# Patient Record
Sex: Female | Born: 1965 | Hispanic: No | Marital: Married | Smoking: Current every day smoker
Health system: Southern US, Community
[De-identification: ages and names within clinical notes are randomized; demographics above are authoritative.]

## PROBLEM LIST (undated history)

## (undated) DIAGNOSIS — M549 Dorsalgia, unspecified: Secondary | ICD-10-CM

---

## 1998-09-13 ENCOUNTER — Emergency Department (HOSPITAL_COMMUNITY): Admission: EM | Admit: 1998-09-13 | Discharge: 1998-09-13 | Payer: Self-pay | Admitting: Emergency Medicine

## 1999-09-20 ENCOUNTER — Emergency Department (HOSPITAL_COMMUNITY): Admission: EM | Admit: 1999-09-20 | Discharge: 1999-09-20 | Payer: Self-pay | Admitting: Emergency Medicine

## 2008-02-14 ENCOUNTER — Other Ambulatory Visit: Admission: RE | Admit: 2008-02-14 | Discharge: 2008-02-14 | Payer: Self-pay | Admitting: Family Medicine

## 2013-07-01 ENCOUNTER — Encounter (HOSPITAL_COMMUNITY): Payer: Self-pay | Admitting: Emergency Medicine

## 2013-07-01 ENCOUNTER — Emergency Department (HOSPITAL_COMMUNITY): Payer: Worker's Compensation

## 2013-07-01 ENCOUNTER — Emergency Department (HOSPITAL_COMMUNITY)
Admission: EM | Admit: 2013-07-01 | Discharge: 2013-07-01 | Disposition: A | Discharge status: Home / Self Care | Payer: Worker's Compensation | Attending: Emergency Medicine | Admitting: Emergency Medicine

## 2013-07-01 DIAGNOSIS — R259 Unspecified abnormal involuntary movements: Secondary | ICD-10-CM | POA: Insufficient documentation

## 2013-07-01 DIAGNOSIS — X500XXA Overexertion from strenuous movement or load, initial encounter: Secondary | ICD-10-CM | POA: Insufficient documentation

## 2013-07-01 DIAGNOSIS — R209 Unspecified disturbances of skin sensation: Secondary | ICD-10-CM | POA: Insufficient documentation

## 2013-07-01 DIAGNOSIS — Y990 Civilian activity done for income or pay: Secondary | ICD-10-CM | POA: Insufficient documentation

## 2013-07-01 DIAGNOSIS — Y9389 Activity, other specified: Secondary | ICD-10-CM | POA: Insufficient documentation

## 2013-07-01 DIAGNOSIS — IMO0002 Reserved for concepts with insufficient information to code with codable children: Secondary | ICD-10-CM | POA: Insufficient documentation

## 2013-07-01 DIAGNOSIS — Y9289 Other specified places as the place of occurrence of the external cause: Secondary | ICD-10-CM | POA: Insufficient documentation

## 2013-07-01 DIAGNOSIS — M549 Dorsalgia, unspecified: Secondary | ICD-10-CM

## 2013-07-01 HISTORY — DX: Dorsalgia, unspecified: M54.9

## 2013-07-01 MED ORDER — DIAZEPAM 5 MG PO TABS
5.0000 mg | ORAL_TABLET | Freq: Once | ORAL | Status: AC
  Administered 2013-07-01: 5 mg via ORAL
  Filled 2013-07-01: qty 1

## 2013-07-01 MED ORDER — PROMETHAZINE HCL 25 MG PO TABS: 25.0000 mg | ORAL_TABLET | Freq: Four times a day (QID) | ORAL | Status: DC | PRN

## 2013-07-01 MED ORDER — OXYCODONE-ACETAMINOPHEN 5-325 MG PO TABS: 2.0000 | ORAL_TABLET | Freq: Four times a day (QID) | ORAL | Status: DC | PRN

## 2013-07-01 MED ORDER — ONDANSETRON 8 MG PO TBDP
8.0000 mg | ORAL_TABLET | Freq: Once | ORAL | Status: AC
  Administered 2013-07-01: 8 mg via ORAL
  Filled 2013-07-01: qty 1

## 2013-07-01 MED ORDER — MORPHINE SULFATE 4 MG/ML IJ SOLN
4.0000 mg | Freq: Once | INTRAMUSCULAR | Status: AC
  Administered 2013-07-01: 4 mg via INTRAMUSCULAR
  Filled 2013-07-01: qty 1

## 2013-07-01 NOTE — ED Provider Notes (Signed)
CSN: [id number]     Arrival date & time 07/01/13  1309 History  This chart was scribed for non-physician practitioner working with Celene Kras, MD by Ashley Jacobs, ED scribe. This patient was seen in room WTR9/WTR9 and the patient's care was started at 2:06 PM.      Chief Complaint  Patient presents with  . Back Pain    Patient is a 47 y.o. female presenting with back pain. The history is provided by the patient and medical records. No language interpreter was used.  Back Pain Location:  Thoracic spine Quality:  Burning Pain severity:  Severe Pain is:  Same all the time Onset quality:  Gradual Timing:  Constant Progression:  Worsening Chronicity:  New Context: lifting heavy objects and occupational injury   Relieved by:  Nothing Worsened by:  Bending, deep breathing and touching Ineffective treatments:  None tried  HPI Comments: Courtney Ramsey is a 47 y.o. 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 She stated that she did have the the pain in her back for her past few weeks and that she has to be in the same postion for eight hours and attributes the pain to it. Pt mentions tremors in legs and left hand is numb because she has been gripping the side of the bed so tightly for so long. The pain sometimes gets so bad that it is hard for her to catch her breath. Pt does not have bowel or bladder incontinence, no drug use, no CA. No fevers.  Pt does not have any known allergies.  Past Medical History  Diagnosis Date  . Back pain    History reviewed. No pertinent past surgical history. No family history on file. History  Substance Use Topics  . Smoking status: Not on file  . Smokeless tobacco: Not on file  . Alcohol Use: Not on file   OB History   Grav Para Term Preterm Abortions TAB SAB Ect Mult Living                  Review of Systems  Gastrointestinal: Negative for nausea and vomiting.  Musculoskeletal: Positive for back pain.  All other systems reviewed and are negative.    Allergies  Review of patient's allergies indicates no known allergies.  Home Medications   Current Outpatient Rx  Name  Route  Sig  Dispense  Refill  . ibuprofen (ADVIL,MOTRIN) 200 MG tablet   Oral   Take 200 mg by mouth every 6 (six) hours as needed for pain.         Marland Kitchen oxyCODONE-acetaminophen (PERCOCET/ROXICET) 5-325 MG per tablet   Oral   Take 2 tablets by mouth every 6 (six) hours as needed for pain.   12 tablet   0   . promethazine (PHENERGAN) 25 MG tablet   Oral   Take 1 tablet (25 mg total) by mouth every 6 (six) hours as needed for nausea.   12 tablet   0    BP 148/88  Pulse 94  Resp 20  SpO2 100% Physical Exam  Nursing note and vitals reviewed. Constitutional: She is oriented to person, place, and time. She appears well-developed and well-nourished. She appears distressed.  Tearful.   HENT:  Head: Normocephalic and atraumatic.  Right Ear: External ear normal.  Left Ear: External ear normal.  Nose: Nose normal.  Mouth/Throat: Oropharynx is clear and moist.  Eyes: Conjunctivae are normal.  Neck: Normal range of motion.  Cardiovascular: Normal rate, regular rhythm, normal heart sounds, intact distal pulses and normal pulses.   Pulmonary/Chest: Effort normal and breath sounds normal. No stridor. No respiratory distress. She has no wheezes. She has no rales.  Abdominal: Soft. She exhibits no distension.  Musculoskeletal: Normal range of motion.  Tenderness to palpitation over thoracic spine with right sided diffuse tenderness. No deformity or step offs  Neurological: She is alert and oriented to person, place, and time. She has normal strength. No sensory deficit. She exhibits normal muscle tone. Coordination normal.  Strength 5/5 in all extremities  Skin: Skin is warm and dry. She is not  diaphoretic. No erythema.  Psychiatric: She has a normal mood and affect. Her behavior is normal.    ED Course  Procedures (including critical care time) DIAGNOSTIC STUDIES: Oxygen Saturation is 100% on room air, normal by my interpretation.    COORDINATION OF CARE: 2:11 PM Discussed course of care with pt which includes pain medication and Zofran . Pt understands and agrees. Labs Review Labs Reviewed - No data to display Imaging Review Dg Ribs Unilateral W/chest Right  07/01/2013   CLINICAL DATA:  Back pain with right rib pain  EXAM: RIGHT RIBS AND CHEST - 3+ VIEW  COMPARISON:  None.  FINDINGS: No fracture or other bone lesions are seen involving the ribs. There is no evidence of pneumothorax or pleural effusion. Both lungs are clear. Heart size and mediastinal contours are within normal limits.  IMPRESSION: Negative.   Electronically Signed   By: Marlan Palau M.D.   On: 07/01/2013 15:05   Dg Thoracic Spine 2 View  07/01/2013   CLINICAL DATA:  Back pain  EXAM: THORACIC SPINE - 2 VIEW  COMPARISON:  None.  FINDINGS: Negative for thoracic fracture. No significant degenerative change.  IMPRESSION: Negative.   Electronically Signed   By: Marlan Palau M.D.   On: 07/01/2013 15:06    MDM   1. Back pain    Patient with back pain.  No neurological deficits and normal neuro exam.  Patient can walk but states is painful.  No loss of bowel or bladder control.  No concern for cauda equina.  No fever, night sweats, weight loss, h/o cancer, IVDU.  RICE protocol and pain medicine indicated and discussed with patient.    I personally performed the services described in this documentation, which was scribed in my presence. The recorded information has been reviewed and is accurate.     Mora Bellman, PA-C 07/01/13 1736  Mora Bellman, PA-C 07/01/13 1737

## 2013-07-01 NOTE — Progress Notes (Signed)
[facility] did not get to see patient but will  Be sending her information about the [facility], using the address provided.

## 2013-07-01 NOTE — ED Notes (Signed)
Pt has a hx of back pain and was working at [employer] in [employer] [place] and while lifting boxes she bent over and mid back.

## 2013-07-03 NOTE — ED Provider Notes (Signed)
Medical screening examination/treatment/procedure(s) were performed by non-physician practitioner and as supervising physician I was immediately available for consultation/collaboration.     Abrar Bilton R Muadh Creasy, MD 07/03/13 0000 

## 2013-09-09 ENCOUNTER — Ambulatory Visit: Payer: BC Managed Care – PPO

## 2013-10-16 ENCOUNTER — Other Ambulatory Visit (HOSPITAL_COMMUNITY)
Admission: RE | Admit: 2013-10-16 | Discharge: 2013-10-16 | Disposition: A | Discharge status: Home / Self Care | Payer: BC Managed Care – PPO | Source: Ambulatory Visit | Attending: Family Medicine | Admitting: Family Medicine

## 2013-10-16 DIAGNOSIS — Z01419 Encounter for gynecological examination (general) (routine) without abnormal findings: Secondary | ICD-10-CM | POA: Insufficient documentation

## 2014-02-05 ENCOUNTER — Other Ambulatory Visit: Payer: Self-pay | Admitting: Family Medicine

## 2014-02-05 ENCOUNTER — Ambulatory Visit
Admission: RE | Admit: 2014-02-05 | Discharge: 2014-02-05 | Disposition: A | Discharge status: Home / Self Care | Payer: BC Managed Care – PPO | Source: Ambulatory Visit | Attending: Family Medicine | Admitting: Family Medicine

## 2014-02-05 DIAGNOSIS — Z008 Encounter for other general examination: Secondary | ICD-10-CM

## 2014-04-16 IMAGING — CR DG RIBS W/ CHEST 3+V*R*
3 series · 3 of 3 positions shown · non-contrast
Comparison: None.

CLINICAL DATA: Back pain with right rib pain

EXAM:
RIGHT RIBS AND CHEST - 3+ VIEW

[w chest pa]
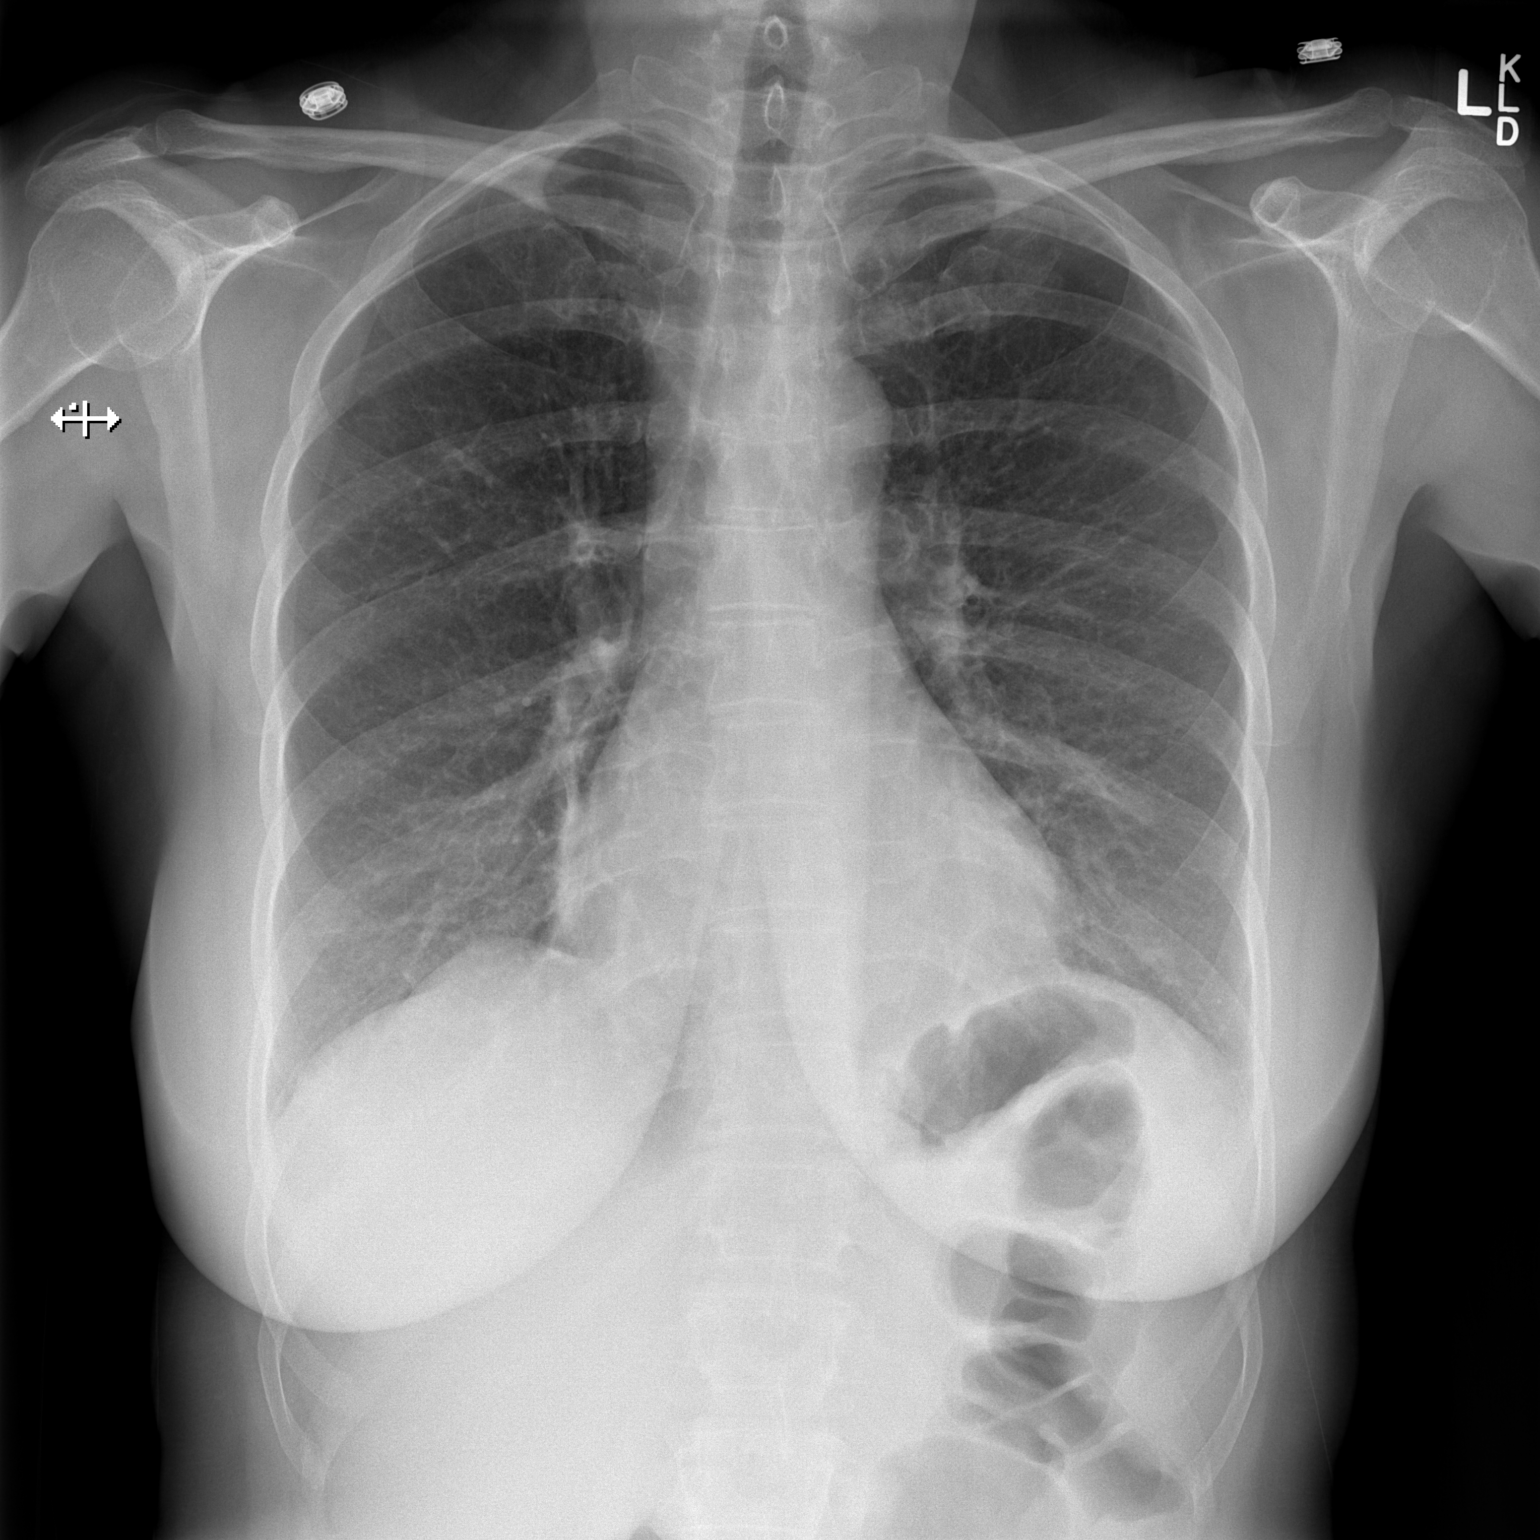

[w ribs ap upper right]
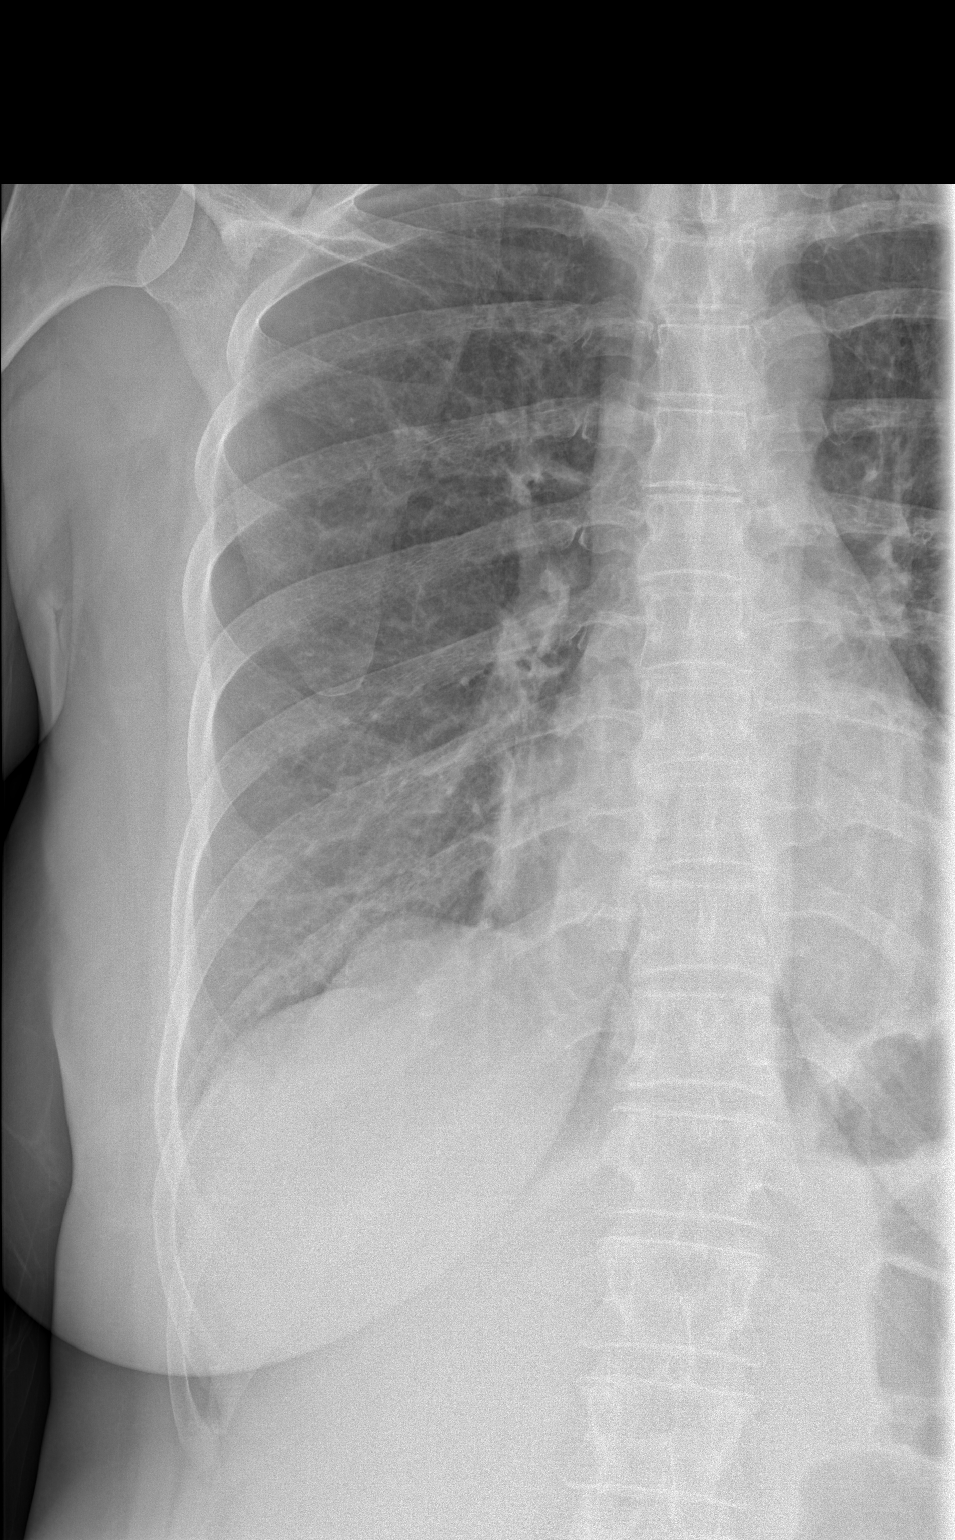

[w ribs obl right]
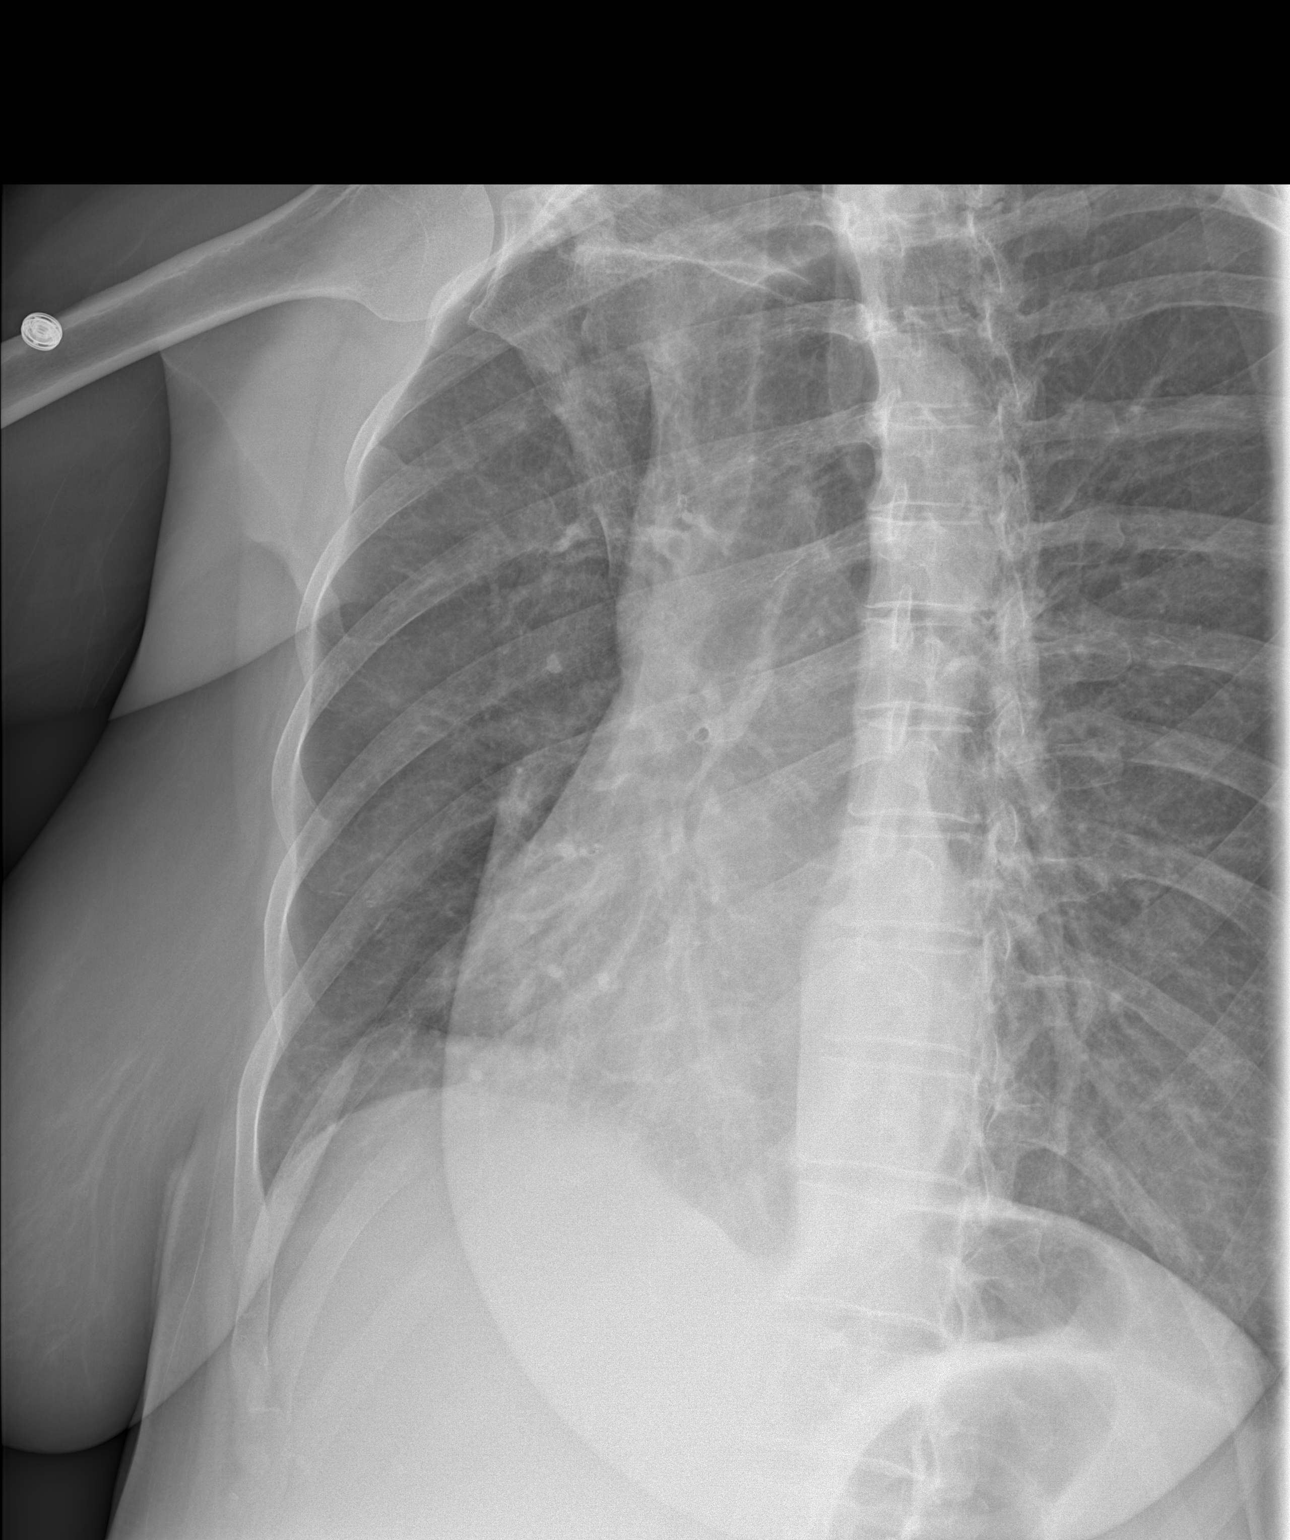

[3 of 3 positions shown; findings below may reference images not displayed]

FINDINGS: No fracture or other bone lesions are seen involving the ribs. There
is no evidence of pneumothorax or pleural effusion. Both lungs are
clear. Heart size and mediastinal contours are within normal limits.
IMPRESSION: Negative.

## 2016-02-26 ENCOUNTER — Ambulatory Visit (INDEPENDENT_AMBULATORY_CARE_PROVIDER_SITE_OTHER): Payer: BLUE CROSS/BLUE SHIELD | Admitting: Family Medicine

## 2016-02-26 VITALS — BP 119/69 | HR 71 | Temp 97.9°F | Resp 16 | Ht 65.0 in | Wt 178.0 lb

## 2016-02-26 DIAGNOSIS — G44209 Tension-type headache, unspecified, not intractable: Secondary | ICD-10-CM

## 2016-02-26 DIAGNOSIS — R0789 Other chest pain: Secondary | ICD-10-CM

## 2016-02-26 DIAGNOSIS — F439 Reaction to severe stress, unspecified: Secondary | ICD-10-CM

## 2016-02-26 DIAGNOSIS — R002 Palpitations: Secondary | ICD-10-CM

## 2016-02-26 DIAGNOSIS — Z658 Other specified problems related to psychosocial circumstances: Secondary | ICD-10-CM

## 2016-02-26 DIAGNOSIS — M542 Cervicalgia: Secondary | ICD-10-CM

## 2016-02-26 LAB — POCT CBC
Granulocyte percent: 59.5 %G (ref 37–80)
HCT, POC: 46.1 % (ref 37.7–47.9)
Hemoglobin: 16.5 g/dL — AB (ref 12.2–16.2)
LYMPH, POC: 4.4 — AB (ref 0.6–3.4)
MCH: 32.2 pg — AB (ref 27–31.2)
MCHC: 35.8 g/dL — AB (ref 31.8–35.4)
MCV: 89.8 fL (ref 80–97)
MID (cbc): 0.6 (ref 0–0.9)
MPV: 8.6 fL (ref 0–99.8)
PLATELET COUNT, POC: 257 10*3/uL (ref 142–424)
POC Granulocyte: 7.3 — AB (ref 2–6.9)
POC LYMPH PERCENT: 35.4 %L (ref 10–50)
POC MID %: 5.1 %M (ref 0–12)
RBC: 5.14 M/uL (ref 4.04–5.48)
RDW, POC: 13.2 %
WBC: 12.3 10*3/uL — AB (ref 4.6–10.2)

## 2016-02-26 LAB — TSH: TSH: 2.51 mIU/L

## 2016-02-26 MED ORDER — CLONAZEPAM 0.5 MG PO TABS: 0.5000 mg | ORAL_TABLET | Freq: Three times a day (TID) | ORAL | Status: DC | PRN

## 2016-02-26 NOTE — Patient Instructions (Addendum)
IF you received an x-ray today, you will receive an invoice from [facility]. Please contact [facility] at [phone] with questions or concerns regarding your invoice.   IF you received labwork today, you will receive an invoice from [employer]. Please contact [facility] at [phone] with questions or concerns regarding your invoice.   Our billing staff will not be able to assist you with questions regarding bills from these companies.  You will be contacted with the lab results as soon as they are available. The fastest way to get your results is to activate your My Chart account. Instructions are located on the last page of this paperwork. If you have not heard from [place] regarding the results in 2 weeks, please contact this office.    Your headache and neck pain appears to be due to muscle spasm and tension headache from the current stress you're experiencing. Try the clonazepam that was prescribed for stress, heat and gentle range of motion and stretching, Icy hot, and Tylenol over-the-counter if needed.  recheck in next 1 week.  If this is not helping your headache or neck pain, the symptoms worsen, or you have any fever, return here or emergency room sooner. Otherwise can recheck next week and repeat blood count at that time as your infection fighting cells were borderline elevated today.   I did provide a note out of work this next week, but follow-up with me within that time period to see how things are going. I would like for you to meet with a counselor either through your work EAP program, or see the numbers provided below. Additionally you can take the clonazepam up to 3 times per day as needed for anxiety or stress.  Work on increasing exercise or activity including walking at least once per day to help with stress, and recheck here or the emergency room if any your symptoms worsen prior to follow-up.  Vivia Budge: [phone] Arvil Chaco: [phone]   Stress and Stress Management Stress is a normal reaction to life events. It is what you feel when life demands more than you are used to or more than you can handle. Some stress can be useful. For example, the stress reaction can help you catch the last bus of the day, study for a test, or meet a deadline at work. But stress that occurs too often or for too long can cause problems. It can affect your emotional health and interfere with relationships and normal daily activities. Too much stress can weaken your immune system and increase your risk for physical illness. If you already have a medical problem, stress can make it worse. CAUSES  All sorts of life events may cause stress. An event that causes stress for one person may not be stressful for another person. Major life events commonly cause stress. These may be positive or negative. Examples include losing your job, moving into a new home, getting married, having a baby, or losing a loved one. Less obvious life events may also cause stress, especially if they occur day after day or in combination. Examples include working long hours, driving in traffic, caring for children, being in debt, or being in a difficult relationship. SIGNS AND SYMPTOMS Stress may cause emotional symptoms including, the following:  Anxiety. This is feeling worried, afraid, on edge, overwhelmed, or out of control.  Anger. This is feeling irritated or impatient.  Depression. This is feeling sad, down, helpless, or guilty.  Difficulty focusing,  remembering, or making decisions. Stress may cause physical symptoms, including the following:   Aches and pains. These may affect your head, neck, back, stomach, or other areas of your body.  Tight muscles or clenched jaw.  Low energy or trouble sleeping. Stress may cause unhealthy behaviors, including the following:   Eating to feel better (overeating) or skipping meals.  Sleeping too little, too  much, or both.  Working too much or putting off tasks (procrastination).  Smoking, drinking alcohol, or using drugs to feel better. DIAGNOSIS  Stress is diagnosed through an assessment by your health care provider. Your health care provider will ask questions about your symptoms and any stressful life events.Your health care provider will also ask about your medical history and may order blood tests or other tests. Certain medical conditions and medicine can cause physical symptoms similar to stress. Mental illness can cause emotional symptoms and unhealthy behaviors similar to stress. Your health care provider may refer you to a mental health professional for further evaluation.  TREATMENT  Stress management is the recommended treatment for stress.The goals of stress management are reducing stressful life events and coping with stress in healthy ways.  Techniques for reducing stressful life events include the following:  Stress identification. Self-monitor for stress and identify what causes stress for you. These skills may help you to avoid some stressful events.  Time management. Set your priorities, keep a calendar of events, and learn to say "no." These tools can help you avoid making too many commitments. Techniques for coping with stress include the following:  Rethinking the problem. Try to think realistically about stressful events rather than ignoring them or overreacting. Try to find the positives in a stressful situation rather than focusing on the negatives.  Exercise. Physical exercise can release both physical and emotional tension. The key is to find a form of exercise you enjoy and do it regularly.  Relaxation techniques. These relax the body and mind. Examples include yoga, meditation, tai chi, biofeedback, deep breathing, progressive muscle relaxation, listening to music, being out in nature, journaling, and other hobbies. Again, the key is to find one or more that you enjoy  and can do regularly.  Healthy lifestyle. Eat a balanced diet, get plenty of sleep, and do not smoke. Avoid using alcohol or drugs to relax.  Strong support network. Spend time with family, friends, or other people you enjoy being around.Express your feelings and talk things over with someone you trust. Counseling or talktherapy with a mental health professional may be helpful if you are having difficulty managing stress on your own. Medicine is typically not recommended for the treatment of stress.Talk to your health care provider if you think you need medicine for symptoms of stress. HOME CARE INSTRUCTIONS  Keep all follow-up visits as directed by your health care provider.  Take all medicines as directed by your health care provider. SEEK MEDICAL CARE IF:  Your symptoms get worse or you start having new symptoms.  You feel overwhelmed by your problems and can no longer manage them on your own. SEEK IMMEDIATE MEDICAL CARE IF:  You feel like hurting yourself or someone else.   This information is not intended to replace advice given to you by your health care provider. Make sure you discuss any questions you have with your health care provider.   Document Released: 03/14/2001 Document Revised: 10/09/2014 Document Reviewed: 05/13/2013 Elsevier Interactive Patient Education [employer].

## 2016-02-26 NOTE — Progress Notes (Addendum)
By signing my name below I, Tereasa Coop, attest that this documentation has been prepared under the direction and in the presence of Wendie Agreste, MD. Electonically Signed. Tereasa Coop, Scribe 02/26/2016 at 1:01 PM   Subjective:    Patient ID: Courtney Ramsey, female    DOB: 23-May-1966, 50 y.o.   MRN: [id number]  Chief Complaint  Patient presents with  . Headache    back of head and front x 1 day   . Neck Pain    x 1 day has been worse, pt. has been hurting off and on x 2 months, has stress   . CHEST TIGHTNESS    HPI Courtney Ramsey is a 50 y.o. female who presents to the [facility] complaining of HA for the past day. HA is in back of neck, top of head, and frontal. Pt has had intermittent neck pain for the past 2 months that has been worse than usual for the past day. HA is described as a tightness and pressure. Neck pain extends into her shoulder. Pt denies any blurred vision, slurred speech, fever, N/V/D, weakness or numbness.  Pt reports increased stress recently. Pt states that she has had similar neck pain and HAs with stress. Upon asking source of stress, pt became tearful. Pt works as a Librarian, academic at [employer]. 2 months ago there was a rule change and she used to work with 5 people and now has to do all the work by her self. Pt feels overwhelmed by the increased workload. Pt has worked for [employer] for 19 years. Pt has been taking OTC Mood plus SAM-E with zinc and  Pt had a coaching session at work yesterday. Pt does not want to go back to work at all.   Pt also c/o chest tightness and chest palpitations when stress is increased.  Pt denies any thoughts of suicide, self harm, homicide, or harming others.   Depression screen PHQ 2/9 02/26/2016  Decreased Interest 1  Down, Depressed, Hopeless 3  PHQ - 2 Score 4  Altered sleeping 3  Tired, decreased energy 3  Change in appetite 3  Feeling bad or failure about yourself  3  Trouble concentrating 0   Moving slowly or fidgety/restless 0  Suicidal thoughts 0  PHQ-9 Score 16  Difficult doing work/chores Not difficult at all       There are no active problems to display for this patient.  Past Medical History  Diagnosis Date  . Back pain    History reviewed. No pertinent past surgical history. No Known Allergies Prior to Admission medications   Medication Sig Start Date End Date Taking? Authorizing Provider  S-Adenosylmethionine (MOOD PLUS SAM-E) 200 MG TBEC Take by mouth.   Yes Historical Provider, MD   Social History   Social History  . Marital Status: Married    Spouse Name: N/A  . Number of Children: N/A  . Years of Education: N/A   Occupational History  . Not on file.   Social History Main Topics  . Smoking status: Current Every Day Smoker  . Smokeless tobacco: Never Used  . Alcohol Use: 0.0 oz/week    0 Standard drinks or equivalent per week  . Drug Use: No  . Sexual Activity: Not on file   Other Topics Concern  . Not on file   Social History Narrative      Review of Systems  Constitutional: Negative for fever, fatigue and unexpected weight change.  Eyes: Negative for visual  disturbance.  Respiratory: Negative for chest tightness and shortness of breath.   Cardiovascular: Positive for palpitations. Negative for chest pain and leg swelling.       Positive for chest tightness.  Gastrointestinal: Negative for abdominal pain and blood in stool.  Musculoskeletal: Positive for neck pain.  Neurological: Positive for headaches. Negative for dizziness, syncope, weakness, light-headedness and numbness.  Psychiatric/Behavioral: Negative for suicidal ideas and self-injury. The patient is nervous/anxious.        Positive for increased stress       Objective:   Physical Exam  Constitutional: She is oriented to person, place, and time. She appears well-developed and well-nourished. No distress.  HENT:  Head: Normocephalic and atraumatic.  Eyes: Conjunctivae  and EOM are normal. Pupils are equal, round, and reactive to light.  Neck: Normal range of motion. Muscular tenderness (paracervical into bilat trapezius) present. Carotid bruit is not present.  Cardiovascular: Normal rate, regular rhythm, normal heart sounds and intact distal pulses.  Exam reveals no gallop and no friction rub.   No murmur heard. Pulmonary/Chest: Effort normal and breath sounds normal. No respiratory distress. She has no wheezes. She has no rales. She exhibits no tenderness.  Abdominal: Soft. She exhibits no pulsatile midline mass. There is no tenderness.  Neurological: She is alert and oriented to person, place, and time. She has normal strength. She is not disoriented. No cranial nerve deficit or sensory deficit. She displays a negative Romberg sign.  Pt has no pronator drift.  Skin: Skin is warm and dry.  Psychiatric: Her speech is normal and behavior is normal. Judgment and thought content normal. Her mood appears anxious. Her affect is labile (tearful throughout history). Cognition and memory are normal. She expresses no homicidal and no suicidal ideation. She expresses no suicidal plans and no homicidal plans.  Vitals reviewed.    Filed Vitals:   02/26/16 1126  BP: 119/69  Pulse: 71  Temp: 97.9 F (36.6 C)  TempSrc: Oral  Resp: 16  Height: _0  (1.651 m)  Weight: 178 lb (80.74 kg)  SpO2: 96%   Results for orders placed or performed in visit on 02/26/16  POCT CBC  Result Value Ref Range   WBC 12.3 (A) 4.6 - 10.2 K/uL   Lymph, poc 4.4 (A) 0.6 - 3.4   POC LYMPH PERCENT 35.4 10 - 50 %L   MID (cbc) 0.6 0 - 0.9   POC MID % 5.1 0 - 12 %M   POC Granulocyte 7.3 (A) 2 - 6.9   Granulocyte percent 59.5 37 - 80 %G   RBC 5.14 4.04 - 5.48 M/uL   Hemoglobin 16.5 (A) 12.2 - 16.2 g/dL   HCT, POC 46.1 37.7 - 47.9 %   MCV 89.8 80 - 97 fL   MCH, POC 32.2 (A) 27 - 31.2 pg   MCHC 35.8 (A) 31.8 - 35.4 g/dL   RDW, POC 13.2 %   Platelet Count, POC 257 142 - 424 K/uL   MPV  8.6 0 - 99.8 fL   EKG: Sinus rhythm, no acute findings.     Assessment & Plan:   Courtney Ramsey is a 50 y.o. female Situational stress - Plan: clonazePAM (KLONOPIN) 0.5 MG tablet  Palpitations - Plan: EKG 12-Lead, POCT CBC, TSH  Neck pain - Plan: clonazePAM (KLONOPIN) 0.5 MG tablet  Tension headache - Plan: clonazePAM (KLONOPIN) 0.5 MG tablet  Chest tightness - Plan: EKG 12-Lead, POCT CBC  Suspected situational stress and anxiety versus component of depression. Primarily  appears to be associated with work/situational stressor. Now at worst symptoms after coaching session yesterday. Suspect the stress is contributing to tension headache, spasm and neck pain, and her chest symptoms as these are only spares when she is stressed. Reassuring EKG in office.  -Note provided for out of work for next 1 week to allow her relative rest and to start working on stress and stress management techniques, as well as to meet with counselor. Instructed to set up counseling through EAP program, or phone numbers provided below.  -Klonopin 0.5 mg 3 times a day when necessary. Side effects discussed.  -Check TSH, but suspect palpitations are also stress related.  -Mild leukocytosis but afebrile, neck was supple, no apparent infection noted at this time. However if fever occurs, headache or neck pain not improving with heat and klonopin/stress management, or worsening of headache/neck pain - return here or emergency room. Can repeat CBC next week in follow-up, sooner if worsening.  - recheck 1 week. Consider SSRI, but her symptoms primarily appear to be due to work past few months.  - RTC precautions regarding chest pain, palpitations, or other worsening or discussed.   Meds ordered this encounter  Medications  . S-Adenosylmethionine (MOOD PLUS SAM-E) 200 MG TBEC    Sig: Take by mouth.  . clonazePAM (KLONOPIN) 0.5 MG tablet    Sig: Take 1 tablet (0.5 mg total) by mouth 3 (three) times daily as needed for  anxiety.    Dispense:  30 tablet    Refill:  0   Klonopin reprinted as initial prescription unavailable. Patient Instructions       IF you received an x-ray today, you will receive an invoice from [facility]. Please contact [facility] at [phone] with questions or concerns regarding your invoice.   IF you received labwork today, you will receive an invoice from [facility] [employer]. Please contact [facility] at [phone] with questions or concerns regarding your invoice.   Our billing staff will not be able to assist you with questions regarding bills from these companies.  You will be contacted with the lab results as soon as they are available. The fastest way to get your results is to activate your My Chart account. Instructions are located on the last page of this paperwork. If you have not heard from [place] regarding the results in 2 weeks, please contact this office.    Your headache and neck pain appears to be due to muscle spasm and tension headache from the current stress you're experiencing. I did provide a note out of work this next week, but follow-up with me within that time period to see how things are going. I would like for you to set up to meet with a counselor either through your work EAP program, or see the numbers provided below. Additionally you can take the clonazepam up to 3 times per day as needed for anxiety or stress, work on increasing exercise or activity including walking at least once per day, and recheck here or the emergency room if any your symptoms worsen prior to follow-up.  Vivia Budge: [phone] Arvil Chaco: [phone]   Stress and Stress Management Stress is a normal reaction to life events. It is what you feel when life demands more than you are used to or more than you can handle. Some stress can be useful. For example, the stress reaction can help you catch the last bus of the day, study for a test, or meet a  deadline at  work. But stress that occurs too often or for too long can cause problems. It can affect your emotional health and interfere with relationships and normal daily activities. Too much stress can weaken your immune system and increase your risk for physical illness. If you already have a medical problem, stress can make it worse. CAUSES  All sorts of life events may cause stress. An event that causes stress for one person may not be stressful for another person. Major life events commonly cause stress. These may be positive or negative. Examples include losing your job, moving into a new home, getting married, having a baby, or losing a loved one. Less obvious life events may also cause stress, especially if they occur day after day or in combination. Examples include working long hours, driving in traffic, caring for children, being in debt, or being in a difficult relationship. SIGNS AND SYMPTOMS Stress may cause emotional symptoms including, the following:  Anxiety. This is feeling worried, afraid, on edge, overwhelmed, or out of control.  Anger. This is feeling irritated or impatient.  Depression. This is feeling sad, down, helpless, or guilty.  Difficulty focusing, remembering, or making decisions. Stress may cause physical symptoms, including the following:   Aches and pains. These may affect your head, neck, back, stomach, or other areas of your body.  Tight muscles or clenched jaw.  Low energy or trouble sleeping. Stress may cause unhealthy behaviors, including the following:   Eating to feel better (overeating) or skipping meals.  Sleeping too little, too much, or both.  Working too much or putting off tasks (procrastination).  Smoking, drinking alcohol, or using drugs to feel better. DIAGNOSIS  Stress is diagnosed through an assessment by your health care provider. Your health care provider will ask questions about your symptoms and any stressful life events.Your  health care provider will also ask about your medical history and may order blood tests or other tests. Certain medical conditions and medicine can cause physical symptoms similar to stress. Mental illness can cause emotional symptoms and unhealthy behaviors similar to stress. Your health care provider may refer you to a mental health professional for further evaluation.  TREATMENT  Stress management is the recommended treatment for stress.The goals of stress management are reducing stressful life events and coping with stress in healthy ways.  Techniques for reducing stressful life events include the following:  Stress identification. Self-monitor for stress and identify what causes stress for you. These skills may help you to avoid some stressful events.  Time management. Set your priorities, keep a calendar of events, and learn to say "no." These tools can help you avoid making too many commitments. Techniques for coping with stress include the following:  Rethinking the problem. Try to think realistically about stressful events rather than ignoring them or overreacting. Try to find the positives in a stressful situation rather than focusing on the negatives.  Exercise. Physical exercise can release both physical and emotional tension. The key is to find a form of exercise you enjoy and do it regularly.  Relaxation techniques. These relax the body and mind. Examples include yoga, meditation, tai chi, biofeedback, deep breathing, progressive muscle relaxation, listening to music, being out in nature, journaling, and other hobbies. Again, the key is to find one or more that you enjoy and can do regularly.  Healthy lifestyle. Eat a balanced diet, get plenty of sleep, and do not smoke. Avoid using alcohol or drugs to relax.  Strong support network. Spend time with family,  friends, or other people you enjoy being around.Express your feelings and talk things over with someone you trust. Counseling  or talktherapy with a mental health professional may be helpful if you are having difficulty managing stress on your own. Medicine is typically not recommended for the treatment of stress.Talk to your health care provider if you think you need medicine for symptoms of stress. HOME CARE INSTRUCTIONS  Keep all follow-up visits as directed by your health care provider.  Take all medicines as directed by your health care provider. SEEK MEDICAL CARE IF:  Your symptoms get worse or you start having new symptoms.  You feel overwhelmed by your problems and can no longer manage them on your own. SEEK IMMEDIATE MEDICAL CARE IF:  You feel like hurting yourself or someone else.   This information is not intended to replace advice given to you by your health care provider. Make sure you discuss any questions you have with your health care provider.   Document Released: 03/14/2001 Document Revised: 10/09/2014 Document Reviewed: 05/13/2013 Elsevier Interactive Patient Education [employer].     I personally performed the services described in this documentation, which was scribed in my presence. The recorded information has been reviewed and considered, and addended by me as needed.

## 2016-03-04 ENCOUNTER — Telehealth: Payer: Self-pay

## 2016-03-04 ENCOUNTER — Ambulatory Visit (INDEPENDENT_AMBULATORY_CARE_PROVIDER_SITE_OTHER): Payer: BLUE CROSS/BLUE SHIELD | Admitting: Family Medicine

## 2016-03-04 VITALS — BP 118/74 | HR 65 | Temp 97.8°F | Resp 16 | Ht 65.0 in | Wt 183.0 lb

## 2016-03-04 DIAGNOSIS — R632 Polyphagia: Secondary | ICD-10-CM

## 2016-03-04 DIAGNOSIS — G44209 Tension-type headache, unspecified, not intractable: Secondary | ICD-10-CM | POA: Diagnosis not present

## 2016-03-04 DIAGNOSIS — F418 Other specified anxiety disorders: Secondary | ICD-10-CM

## 2016-03-04 DIAGNOSIS — Z658 Other specified problems related to psychosocial circumstances: Secondary | ICD-10-CM | POA: Diagnosis not present

## 2016-03-04 DIAGNOSIS — F439 Reaction to severe stress, unspecified: Secondary | ICD-10-CM

## 2016-03-04 DIAGNOSIS — F411 Generalized anxiety disorder: Secondary | ICD-10-CM

## 2016-03-04 DIAGNOSIS — D72829 Elevated white blood cell count, unspecified: Secondary | ICD-10-CM | POA: Diagnosis not present

## 2016-03-04 DIAGNOSIS — M542 Cervicalgia: Secondary | ICD-10-CM

## 2016-03-04 LAB — POCT CBC
Granulocyte percent: 63.7 %G (ref 37–80)
HCT, POC: 43.1 % (ref 37.7–47.9)
Hemoglobin: 15.1 g/dL (ref 12.2–16.2)
Lymph, poc: 3.6 — AB (ref 0.6–3.4)
MCH, POC: 32 pg — AB (ref 27–31.2)
MCHC: 35.1 g/dL (ref 31.8–35.4)
MCV: 91.3 fL (ref 80–97)
MID (CBC): 0.7 (ref 0–0.9)
MPV: 8.6 fL (ref 0–99.8)
POC Granulocyte: 7.6 — AB (ref 2–6.9)
POC LYMPH PERCENT: 30.3 %L (ref 10–50)
POC MID %: 6 % (ref 0–12)
Platelet Count, POC: 213 10*3/uL (ref 142–424)
RBC: 4.72 M/uL (ref 4.04–5.48)
RDW, POC: 13.4 %
WBC: 12 10*3/uL — AB (ref 4.6–10.2)

## 2016-03-04 MED ORDER — FLUOXETINE HCL 20 MG PO TABS: 20.0000 mg | ORAL_TABLET | Freq: Every day | ORAL | Status: AC

## 2016-03-04 MED ORDER — CLONAZEPAM 0.5 MG PO TABS: 0.2500 mg | ORAL_TABLET | Freq: Three times a day (TID) | ORAL | Status: AC | PRN

## 2016-03-04 NOTE — Progress Notes (Signed)
By signing my name below I, Shelah Lewandowsky, attest that this documentation has been prepared under the direction and in the presence of Shade Flood, MD. Electonically Signed. Shelah Lewandowsky, Scribe 03/04/2016 at 3:13 PM   Subjective:    Patient ID: Courtney Ramsey, female    DOB: [birth year], 50 y.o.   MRN: [id number]  Chief Complaint  Patient presents with  . Follow-up    pain in the back of her head    HPI Courtney Ramsey is a 50 y.o. female who presents to the [facility] for follow up evaluation for her posterior HA and neck pain. Pt was seen and evaluated by Dr Neva Seat at [facility] on 02/26/16. Pt was also evaluated for anxiety and situational stress at that time. Pt's TSH was normal, WBC was mildly elevated. Neck was subtle. Pt was afebrile. Suspected tension HA with secondary neck muscle spasm. Clonopin was prescribed. Advised pt to return in one week for reevaluation. Pt also advised to go to ED or return to [facility] if HA or neck pain was not improving with heat, clonopin, and stress management techniques. 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 Numbers were provided and pt was also recommended that she could follow up with EAP program through her work.  Pt states her HA improved over the past week and has been mostly resolved. Pt reports that she did not take her clonopin today and her HA returned today. Pt denies any changes in vision. Pt also reports minor upper neck pain. Pt states that she has taken 3 advil over the past week for HA. Pt also has been heating the back of her neck.  Pt states that she feels much better emotionally after a week away from her work and feels much more relaxed. Pt does not want to return to work. Pt has been having increased stress and anxious at work for the past 2 months. Pt reports that the clonopin made her sleepy whenever she took it. Pt talked to a counselor through her EAP  program at work over the phone 5 days ago. Pt does not think that it helped.   Pt reports that she has been waking up at night to eat food and she gained 5 lbs in the past week.     There are no active problems to display for this patient.  Past Medical History  Diagnosis Date  . Back pain    No past surgical history on file. No Known Allergies Prior to Admission medications   Medication Sig Start Date End Date Taking? Authorizing Provider  clonazePAM (KLONOPIN) 0.5 MG tablet Take 1 tablet (0.5 mg total) by mouth 3 (three) times daily as needed for anxiety. 02/26/16  Yes Shade Flood, MD  S-Adenosylmethionine (MOOD PLUS SAM-E) 200 MG TBEC Take by mouth.   Yes Historical Provider, MD   Social History   Social History  . Marital Status: Married    Spouse Name: N/A  . Number of Children: N/A  . Years of Education: N/A   Occupational History  . Not on file.   Social History Main Topics  . Smoking status: Current Every Day Smoker  . Smokeless tobacco: Never Used  . Alcohol Use: 0.0 oz/week    0 Standard drinks or equivalent per week  . Drug Use: No  . Sexual Activity: Not on file   Other Topics Concern  . Not on file  Social History Narrative      Review of Systems  Constitutional: Negative for fatigue and unexpected weight change.  Respiratory: Negative for chest tightness and shortness of breath.   Cardiovascular: Negative for chest pain, palpitations and leg swelling.  Gastrointestinal: Negative for abdominal pain and blood in stool.  Musculoskeletal: Positive for neck pain.  Neurological: Positive for headaches. Negative for dizziness, syncope and light-headedness.  Psychiatric/Behavioral: Positive for behavioral problems (increased eating) and sleep disturbance.       Objective:   Physical Exam  Constitutional: She is oriented to person, place, and time. She appears well-developed and well-nourished.  HENT:  Head: Normocephalic and atraumatic.  Eyes:  Conjunctivae and EOM are normal. Pupils are equal, round, and reactive to light.  Neck: Neck supple. Muscular tenderness (upper paraspinal muscles only) present. No spinous process tenderness present. Carotid bruit is not present.  Cardiovascular: Normal rate, regular rhythm, normal heart sounds and intact distal pulses.   Pulmonary/Chest: Effort normal and breath sounds normal.  Abdominal: Soft. She exhibits no pulsatile midline mass. There is no tenderness.  Neurological: She is alert and oriented to person, place, and time.  Skin: Skin is warm and dry.  Psychiatric: Her behavior is normal. Her mood appears anxious (very mild).  Pt does report insomnia and increased eating habits over the past week.  Affect was mood congruent.  Pt had good eye contact.   Vitals reviewed.   Filed Vitals:   03/04/16 1144  BP: 118/74  Pulse: 65  Temp: 97.8 F (36.6 C)  TempSrc: Oral  Resp: 16  Height: 5\' 5"  (1.651 m)  Weight: 183 lb (83.008 kg)  SpO2: 97%   Results for orders placed or performed in visit on 03/04/16  POCT CBC  Result Value Ref Range   WBC 12.0 (A) 4.6 - 10.2 K/uL   Lymph, poc 3.6 (A) 0.6 - 3.4   POC LYMPH PERCENT 30.3 10 - 50 %L   MID (cbc) 0.7 0 - 0.9   POC MID % 6.0 0 - 12 %M   POC Granulocyte 7.6 (A) 2 - 6.9   Granulocyte percent 63.7 37 - 80 %G   RBC 4.72 4.04 - 5.48 M/uL   Hemoglobin 15.1 12.2 - 16.2 g/dL   HCT, POC 16.143.1 09.637.7 - 47.9 %   MCV 91.3 80 - 97 fL   MCH, POC 32.0 (A) 27 - 31.2 pg   MCHC 35.1 31.8 - 35.4 g/dL   RDW, POC 04.513.4 %   Platelet Count, POC 213 142 - 424 K/uL   MPV 8.6 0 - 99.8 fL        Assessment & Plan:   Courtney AreasLucija Ramsey is a 50 y.o. female Leukocytosis - Plan: POCT CBC, Pathologist smear review  -Borderline but stable. Will check pathologist smear review, but afebrile, no further workup at this time. RTC precautions regarding fever or other signs of infection.  Situational anxiety - Plan: FLUoxetine (PROZAC) 20 MG tablet Generalized  anxiety disorder - Plan: FLUoxetine (PROZAC) 20 MG tablet Increased appetite Situational stress - Plan: clonazePAM (KLONOPIN) 0.5 MG tablet  -Suspected generalized anxiety disorder now with interval worsening and situational stress at work. Had one counseling visit over the phone which she did not find effective. Will refer to other counselors in the area. Advised her of need to call. Start Prozac, side effects discussed. Continue Klonopin lowest effective dose, can try half pill time. We'll have out of work for another 2 weeks to allow counseling to start. If she  feels she is ready to return to work sooner, to let me know, otherwise plan on follow-up June 16.   Tension headache - Plan: clonazePAM (KLONOPIN) 0.5 MG tablet Neck pain - Plan: clonazePAM (KLONOPIN) 0.5 MG tablet  -Improved when taking Klonopin, likely tension/stress headache. Okay to continue ibuprofen as needed, heat, ice, gentle range of motion, and if not continuing to improve with treatment of anxiety, or worsening sooner, consider neurology evaluation or other imaging.  Meds ordered this encounter  Medications  . FLUoxetine (PROZAC) 20 MG tablet    Sig: Take 1 tablet (20 mg total) by mouth daily.    Dispense:  30 tablet    Refill:  1  . clonazePAM (KLONOPIN) 0.5 MG tablet    Sig: Take 0.5-1 tablets (0.25-0.5 mg total) by mouth 3 (three) times daily as needed for anxiety.    Dispense:  30 tablet    Refill:  0   Patient Instructions       IF you received an x-ray today, you will receive an invoice from [facility]. Please contact [facility] at [phone] with questions or concerns regarding your invoice.   IF you received labwork today, you will receive an invoice from [employer]. Please contact [facility] at [phone] with questions or concerns regarding your invoice.   Our billing staff will not be able to assist you with questions regarding bills from these  companies.  You will be contacted with the lab results as soon as they are available. The fastest way to get your results is to activate your My Chart account. Instructions are located on the last page of this paperwork. If you have not heard from [place] regarding the results in 2 weeks, please contact this office.    Call counselor Monday to set up appointment. See numbers below.   Karmen Bongo: [phone] Arbutus Ped: [phone] Verlan Friends: [phone]   Start Prozac 20 mg each day. For breakthrough anxiety symptoms, he can take one half to one of the clonazepam. Out of work for the next 2 weeks, then we can consider going back to part-time. If you feel you're able to return to work sooner, return to recheck with me sooner.  Follow-up with me Friday, June 16 between 8 AM and noon.  Ok to continue Advil as needed for your headache and neck pain. Continue to apply heat or ice if this helps and gentle stretches. If the neck pain or headache acutely worsens, return here or the emergency room.  Infection fighting cells in your blood are still borderline elevated, but stable. I will send off another tests to make sure these looked okay. If you have fever or any new symptoms prior to follow-up with me in 2 weeks, return here or other medical care provider.  Return to the clinic or go to the nearest emergency room if any of your symptoms worsen or new symptoms occur.  Generalized Anxiety Disorder Generalized anxiety disorder (GAD) is a mental disorder. It interferes with life functions, including relationships, work, and school. GAD is different from normal anxiety, which everyone experiences at some point in their lives in response to specific life events and activities. Normal anxiety actually helps [place] prepare for and get through these life events and activities. Normal anxiety goes away after the event or activity is over.  GAD causes anxiety that is not necessarily related to specific events or  activities. It also causes excess anxiety in proportion to specific events or activities. The anxiety associated  with GAD is also difficult to control. GAD can vary from mild to severe. People with severe GAD can have intense waves of anxiety with physical symptoms (panic attacks).  SYMPTOMS The anxiety and worry associated with GAD are difficult to control. This anxiety and worry are related to many life events and activities and also occur more days than not for 6 months or longer. People with GAD also have three or more of the following symptoms (one or more in children):  Restlessness.   Fatigue.  Difficulty concentrating.   Irritability.  Muscle tension.  Difficulty sleeping or unsatisfying sleep. DIAGNOSIS GAD is diagnosed through an assessment by your health care provider. Your health care provider will ask you questions aboutyour mood,physical symptoms, and events in your life. Your health care provider may ask you about your medical history and use of alcohol or drugs, including prescription medicines. Your health care provider may also do a physical exam and blood tests. Certain medical conditions and the use of certain substances can cause symptoms similar to those associated with GAD. Your health care provider may refer you to a mental health specialist for further evaluation. TREATMENT The following therapies are usually used to treat GAD:   Medication. Antidepressant medication usually is prescribed for long-term daily control. Antianxiety medicines may be added in severe cases, especially when panic attacks occur.   Talk therapy (psychotherapy). Certain types of talk therapy can be helpful in treating GAD by providing support, education, and guidance. A form of talk therapy called cognitive behavioral therapy can teach you healthy ways to think about and react to daily life events and activities.  Stress managementtechniques. These include yoga, meditation, and exercise  and can be very helpful when they are practiced regularly. A mental health specialist can help determine which treatment is best for you. Some people see improvement with one therapy. However, other people require a combination of therapies.   This information is not intended to replace advice given to you by your health care provider. Make sure you discuss any questions you have with your health care provider.   Document Released: 01/13/2013 Document Revised: 10/09/2014 Document Reviewed: 01/13/2013 Elsevier Interactive Patient Education [employer]      I personally performed the services described in this documentation, which was scribed in my presence. The recorded information has been reviewed and considered, and addended by me as needed.   Signed,   Meredith Staggers, MD [facility].  03/06/2016 11:24 AM

## 2016-03-04 NOTE — Patient Instructions (Addendum)
IF you received an x-ray today, you will receive an invoice from [facility]. Please contact [facility] at [phone] with questions or concerns regarding your invoice.   IF you received labwork today, you will receive an invoice from [employer]. Please contact [facility] at [phone] with questions or concerns regarding your invoice.   Our billing staff will not be able to assist you with questions regarding bills from these companies.  You will be contacted with the lab results as soon as they are available. The fastest way to get your results is to activate your My Chart account. Instructions are located on the last page of this paperwork. If you have not heard from [place] regarding the results in 2 weeks, please contact this office.    Call counselor Monday to set up appointment. See numbers below.   Karmen Bongo: [phone] Ar[place] Ped: [phone] Verlan Friends: [phone]   Start Prozac 20 mg each day. For breakthrough anxiety symptoms, he can take one half to one of the clonazepam. Out of work for the next 2 weeks, then we can consider going back to part-time. If you feel you're able to return to work sooner, return to recheck with me sooner.  Follow-up with me Friday, June 16 between 8 AM and noon.  Ok to continue Advil as needed for your headache and neck pain. Continue to apply heat or ice if this helps and gentle stretches. If the neck pain or headache acutely worsens, return here or the emergency room.  Infection fighting cells in your blood are still borderline elevated, but stable. I will send off another tests to make sure these looked okay. If you have fever or any new symptoms prior to follow-up with me in 2 weeks, return here or other medical care provider.  Return to the clinic or go to the nearest emergency room if any of your symptoms worsen or new symptoms occur.  Generalized Anxiety Disorder Generalized anxiety disorder  (GAD) is a mental disorder. It interferes with life functions, including relationships, work, and school. GAD is different from normal anxiety, which everyone experiences at some point in their lives in response to specific life events and activities. Normal anxiety actually helps [place] prepare for and get through these life events and activities. Normal anxiety goes away after the event or activity is over.  GAD causes anxiety that is not necessarily related to specific events or activities. It also causes excess anxiety in proportion to specific events or activities. The anxiety associated with GAD is also difficult to control. GAD can vary from mild to severe. People with severe GAD can have intense waves of anxiety with physical symptoms (panic attacks).  SYMPTOMS The anxiety and worry associated with GAD are difficult to control. This anxiety and worry are related to many life events and activities and also occur more days than not for 6 months or longer. People with GAD also have three or more of the following symptoms (one or more in children):  Restlessness.   Fatigue.  Difficulty concentrating.   Irritability.  Muscle tension.  Difficulty sleeping or unsatisfying sleep. DIAGNOSIS GAD is diagnosed through an assessment by your health care provider. Your health care provider will ask you questions aboutyour mood,physical symptoms, and events in your life. Your health care provider may ask you about your medical history and use of alcohol or drugs, including prescription medicines. Your health care provider may also do a physical exam and blood tests. Certain medical  conditions and the use of certain substances can cause symptoms similar to those associated with GAD. Your health care provider may refer you to a mental health specialist for further evaluation. TREATMENT The following therapies are usually used to treat GAD:   Medication. Antidepressant medication usually is prescribed for  long-term daily control. Antianxiety medicines may be added in severe cases, especially when panic attacks occur.   Talk therapy (psychotherapy). Certain types of talk therapy can be helpful in treating GAD by providing support, education, and guidance. A form of talk therapy called cognitive behavioral therapy can teach you healthy ways to think about and react to daily life events and activities.  Stress managementtechniques. These include yoga, meditation, and exercise and can be very helpful when they are practiced regularly. A mental health specialist can help determine which treatment is best for you. Some people see improvement with one therapy. However, other people require a combination of therapies.   This information is not intended to replace advice given to you by your health care provider. Make sure you discuss any questions you have with your health care provider.   Document Released: 01/13/2013 Document Revised: 10/09/2014 Document Reviewed: 01/13/2013 Elsevier Interactive Patient Education [employer] Inc[employer]

## 2016-03-06 ENCOUNTER — Telehealth: Payer: Self-pay

## 2016-03-06 LAB — PATHOLOGIST SMEAR REVIEW

## 2016-03-06 NOTE — Telephone Encounter (Signed)
Patient needs FMLA forms completed by Dr Neva SeatGreene, I have completed what I could from the OV notes if you could complete the highlighted areas please. I will place them in your box on 03/06/16 if you could return them to the FMLA/Disability box within 5-7 business day. Thank you!

## 2016-03-07 NOTE — Telephone Encounter (Signed)
Completed.  Did not complete RTW form at this time as OOW until next ov.

## 2016-03-08 NOTE — Telephone Encounter (Signed)
Paperwork scanned and faxed on 03/08/16

## 2016-03-17 ENCOUNTER — Ambulatory Visit (INDEPENDENT_AMBULATORY_CARE_PROVIDER_SITE_OTHER): Payer: BLUE CROSS/BLUE SHIELD | Admitting: Family Medicine

## 2016-03-17 VITALS — BP 112/70 | HR 71 | Temp 98.0°F | Resp 14 | Ht 65.0 in | Wt 179.0 lb

## 2016-03-17 DIAGNOSIS — F411 Generalized anxiety disorder: Secondary | ICD-10-CM | POA: Diagnosis not present

## 2016-03-17 DIAGNOSIS — F418 Other specified anxiety disorders: Secondary | ICD-10-CM | POA: Diagnosis not present

## 2016-03-17 DIAGNOSIS — R51 Headache: Secondary | ICD-10-CM

## 2016-03-17 DIAGNOSIS — R519 Headache, unspecified: Secondary | ICD-10-CM

## 2016-03-17 DIAGNOSIS — Z0271 Encounter for disability determination: Secondary | ICD-10-CM

## 2016-03-17 NOTE — Patient Instructions (Addendum)
IF you received an x-ray today, you will receive an invoice from [facility]. Please contact [facility] at [phone] with questions or concerns regarding your invoice.   IF you received labwork today, you will receive an invoice from [employer]. Please contact [facility] at [phone] with questions or concerns regarding your invoice.   Our billing staff will not be able to assist you with questions regarding bills from these companies.  You will be contacted with the lab results as soon as they are available. The fastest way to get your results is to activate your My Chart account. Instructions are located on the last page of this paperwork. If you have not heard from [place] regarding the results in 2 weeks, please contact this office.     We recommend that you schedule a mammogram for breast cancer screening. Typically, you do not need a referral to do this. Please contact a local imaging center to schedule your mammogram.  [facility] - [phone]  *ask for the Radiology Department [facility] ([place]) - [phone] or [phone]  [facility] - [phone] [place] [phone] [facility] - [phone]  *ask for the [facility] - [phone]  *ask for the Radiology Department [facility] - [phone]  *ask for the [place] - [phone]  Continue range of motion and heat to the back of your neck if needed for pain. You can also take Advil if you're having headaches. If these headaches persist, I can have a neurologist see as this may be from a pinched nerve in the neck.  Plan on return to work on Monday, see the letter that I printed for your employer. Follow-up with me in 2 weeks. However continue the Klonopin at bedtime as needed, continue the Prozac every day, and it  is very important that you call and establish an appointment with a counselor. This can be through your EAP at work, or call your insurance company to see who is in network. If any worsening of symptoms prior to our follow-up in 2 weeks, return sooner.  Make sure you read through the information on stress and stress management, and use the relaxation techniques we discussed when you do return to work.  Stress and Stress Management Stress is a normal reaction to life events. It is what you feel when life demands more than you are used to or more than you can handle. Some stress can be useful. For example, the stress reaction can help you catch the last bus of the day, study for a test, or meet a deadline at work. But stress that occurs too often or for too long can cause problems. It can affect your emotional health and interfere with relationships and normal daily activities. Too much stress can weaken your immune system and increase your risk for physical illness. If you already have a medical problem, stress can make it worse. CAUSES  All sorts of life events may cause stress. An event that causes stress for one person may not be stressful for another person. Major life events commonly cause stress. These may be positive or negative. Examples include losing your job, moving into a new home, getting married, having a baby, or losing a loved one. Less obvious life events may also cause stress, especially if they occur day after day or in combination. Examples include working long  hours, driving in traffic, caring for children, being in debt, or being in a difficult relationship. SIGNS AND SYMPTOMS Stress may cause emotional symptoms including, the following:  Anxiety. This is feeling worried, afraid, on edge, overwhelmed, or out of control.  Anger. This is feeling irritated or impatient.  Depression. This is feeling sad, down, helpless, or guilty.  Difficulty focusing, remembering, or making  decisions. Stress may cause physical symptoms, including the following:   Aches and pains. These may affect your head, neck, back, stomach, or other areas of your body.  Tight muscles or clenched jaw.  Low energy or trouble sleeping. Stress may cause unhealthy behaviors, including the following:   Eating to feel better (overeating) or skipping meals.  Sleeping too little, too much, or both.  Working too much or putting off tasks (procrastination).  Smoking, drinking alcohol, or using drugs to feel better. DIAGNOSIS  Stress is diagnosed through an assessment by your health care provider. Your health care provider will ask questions about your symptoms and any stressful life events.Your health care provider will also ask about your medical history and may order blood tests or other tests. Certain medical conditions and medicine can cause physical symptoms similar to stress. Mental illness can cause emotional symptoms and unhealthy behaviors similar to stress. Your health care provider may refer you to a mental health professional for further evaluation.  TREATMENT  Stress management is the recommended treatment for stress.The goals of stress management are reducing stressful life events and coping with stress in healthy ways.  Techniques for reducing stressful life events include the following:  Stress identification. Self-monitor for stress and identify what causes stress for you. These skills may help you to avoid some stressful events.  Time management. Set your priorities, keep a calendar of events, and learn to say "no." These tools can help you avoid making too many commitments. Techniques for coping with stress include the following:  Rethinking the problem. Try to think realistically about stressful events rather than ignoring them or overreacting. Try to find the positives in a stressful situation rather than focusing on the negatives.  Exercise. Physical exercise can release  both physical and emotional tension. The key is to find a form of exercise you enjoy and do it regularly.  Relaxation techniques. These relax the body and mind. Examples include yoga, meditation, tai chi, biofeedback, deep breathing, progressive muscle relaxation, listening to music, being out in nature, journaling, and other hobbies. Again, the key is to find one or more that you enjoy and can do regularly.  Healthy lifestyle. Eat a balanced diet, get plenty of sleep, and do not smoke. Avoid using alcohol or drugs to relax.  Strong support network. Spend time with family, friends, or other people you enjoy being around.Express your feelings and talk things over with someone you trust. Counseling or talktherapy with a mental health professional may be helpful if you are having difficulty managing stress on your own. Medicine is typically not recommended for the treatment of stress.Talk to your health care provider if you think you need medicine for symptoms of stress. HOME CARE INSTRUCTIONS  Keep all follow-up visits as directed by your health care provider.  Take all medicines as directed by your health care provider. SEEK MEDICAL CARE IF:  Your symptoms get worse or you start having new symptoms.  You feel overwhelmed by your problems and can no longer manage them on your own. SEEK IMMEDIATE MEDICAL CARE IF:  You feel like hurting yourself or  someone else.   This information is not intended to replace advice given to you by your health care provider. Make sure you discuss any questions you have with your health care provider.   Document Released: 03/14/2001 Document Revised: 10/09/2014 Document Reviewed: 05/13/2013 Elsevier Interactive Patient Education 2016 [employer] Generalized Anxiety Disorder Generalized anxiety disorder (GAD) is a mental disorder. It interferes with life functions, including relationships, work, and school. GAD is different from normal anxiety, which  everyone experiences at some point in their lives in response to specific life events and activities. Normal anxiety actually helps [place] prepare for and get through these life events and activities. Normal anxiety goes away after the event or activity is over.  GAD causes anxiety that is not necessarily related to specific events or activities. It also causes excess anxiety in proportion to specific events or activities. The anxiety associated with GAD is also difficult to control. GAD can vary from mild to severe. People with severe GAD can have intense waves of anxiety with physical symptoms (panic attacks).  SYMPTOMS The anxiety and worry associated with GAD are difficult to control. This anxiety and worry are related to many life events and activities and also occur more days than not for 6 months or longer. People with GAD also have three or more of the following symptoms (one or more in children):  Restlessness.   Fatigue.  Difficulty concentrating.   Irritability.  Muscle tension.  Difficulty sleeping or unsatisfying sleep. DIAGNOSIS GAD is diagnosed through an assessment by your health care provider. Your health care provider will ask you questions aboutyour mood,physical symptoms, and events in your life. Your health care provider may ask you about your medical history and use of alcohol or drugs, including prescription medicines. Your health care provider may also do a physical exam and blood tests. Certain medical conditions and the use of certain substances can cause symptoms similar to those associated with GAD. Your health care provider may refer you to a mental health specialist for further evaluation. TREATMENT The following therapies are usually used to treat GAD:   Medication. Antidepressant medication usually is prescribed for long-term daily control. Antianxiety medicines may be added in severe cases, especially when panic attacks occur.   Talk therapy (psychotherapy).  Certain types of talk therapy can be helpful in treating GAD by providing support, education, and guidance. A form of talk therapy called cognitive behavioral therapy can teach you healthy ways to think about and react to daily life events and activities.  Stress managementtechniques. These include yoga, meditation, and exercise and can be very helpful when they are practiced regularly. A mental health specialist can help determine which treatment is best for you. Some people see improvement with one therapy. However, other people require a combination of therapies.   This information is not intended to replace advice given to you by your health care provider. Make sure you discuss any questions you have with your health care provider.   Document Released: 01/13/2013 Document Revised: 10/09/2014 Document Reviewed: 01/13/2013 Elsevier Interactive Patient Education [employer].

## 2016-03-17 NOTE — Progress Notes (Signed)
By signing my name below I, Tereasa Coop, attest that this documentation has been prepared under the direction and in the presence of Wendie Agreste, MD. Electonically Signed. Tereasa Coop, Scribe 03/17/2016 at 1:22 PM  Subjective:    Patient ID: Courtney Ramsey, female    DOB: [birth year], 50 y.o.   MRN: [id number]  Chief Complaint  Patient presents with  . Follow-up    headache, neck pain     HPI Jordane Roets is a 50 y.o. female who presents to the [facility] for follow up for anxiety related to situational stressors. Pt was initially seen and evaluated by Dr Carlota Raspberry at [facility] on 02/26/16 and again for a follow up visit on 03/04/16. Pt has also been c/o HA and neck pain during past visits which was thought to be tension HA related to stress.  Pt was taken out of work on 02/26/16, had one telephone visit with counseling through [employer], numbers were provided for other counselors at last visit. Treated initially with klonopin, then started prozac last visit due to pt's long standing anxiety symptoms. Currently planned return to work date is tomorrow. As of last visit HA and neck pain were improving with heat and advil.  Pt states that she is doing well and the prozac has been working well. Pt states she has been taking the prozac faithfully with no side effects. Pt states she feels less stressed than during prior visits. Pt denies any crying or tearful spells in the past 2 weeks. Pt expresses concern about going back to work.  Pt has been swimming or walking on a treadmill approximately 4-5 times a week.  Pt states that she has not had any HA or neck pain for the past 2 weeks until a couple episodes of sharp stabbing neck pain yesterday and this morning. Pt has been taking advil for the past 2 weeks and did not take any advil yesterday or today. Pt has also been applying hot compress to the back of her neck. No HA today or yesterday. Pt denies any fever, cough, sinus  congestion, or general malaise.   Pt has not met any new counselor since the one phone call due to being told that insurance would not cover any counseling outside of her work [employer].  Pt is taking klonopin every night to help with sleeping. Pt states that she cannot stay asleep very well if she does not take it.   Leukocytosis WBC 12.3 then 12.0 last visit.     There are no active problems to display for this patient.  Past Medical History  Diagnosis Date  . Back pain    No past surgical history on file. No Known Allergies Prior to Admission medications   Medication Sig Start Date End Date Taking? Authorizing Provider  clonazePAM (KLONOPIN) 0.5 MG tablet Take 0.5-1 tablets (0.25-0.5 mg total) by mouth 3 (three) times daily as needed for anxiety. 03/04/16  Yes Wendie Agreste, MD  FLUoxetine (PROZAC) 20 MG tablet Take 1 tablet (20 mg total) by mouth daily. 03/04/16  Yes Wendie Agreste, MD  S-Adenosylmethionine (MOOD PLUS SAM-E) 200 MG TBEC Take by mouth.   Yes Historical Provider, MD   Social History   Social History  . Marital Status: Married    Spouse Name: N/A  . Number of Children: N/A  . Years of Education: N/A   Occupational History  . Not on file.   Social History Main Topics  . Smoking status: Current  Every Day Smoker -- 1.00 packs/day for 34 years    Types: Cigarettes  . Smokeless tobacco: Never Used  . Alcohol Use: 0.0 oz/week    0 Standard drinks or equivalent per week  . Drug Use: No  . Sexual Activity: Not on file   Other Topics Concern  . Not on file   Social History Narrative      Review of Systems  Constitutional: Negative for fever.  HENT: Negative for congestion.   Respiratory: Negative for cough.   Musculoskeletal: Positive for neck pain.  Neurological: Negative for headaches.  Psychiatric/Behavioral: Negative for sleep disturbance.       Objective:   Physical Exam  Constitutional: She is oriented to person, place, and time. She appears  well-developed and well-nourished. No distress.  HENT:  Head: Normocephalic and atraumatic.  Eyes: Conjunctivae are normal. Pupils are equal, round, and reactive to light.  Neck: Normal range of motion and full passive range of motion without pain. Neck supple. Normal range of motion present. No thyromegaly present.  Cardiovascular: Normal rate, regular rhythm and normal heart sounds.  Exam reveals no gallop and no friction rub.   No murmur heard. Pulmonary/Chest: Effort normal and breath sounds normal. No accessory muscle usage. She has no decreased breath sounds. She has no wheezes. She has no rhonchi. She has no rales.  Musculoskeletal: Normal range of motion.       Cervical back: Normal. She exhibits normal range of motion.  Pt has full and equal strength in bilat upper extremities.   Neurological: She is alert and oriented to person, place, and time. She has normal strength. Gait normal.  Pt has full and equal upper extremity strength bilat.   Skin: Skin is warm and dry.  Psychiatric: Her speech is normal and behavior is normal. Her mood appears anxious (minimally).  Nursing note and vitals reviewed.    Filed Vitals:   03/17/16 1158  BP: 112/70  Pulse: 71  Temp: 98 F (36.7 C)  TempSrc: Oral  Resp: 14  Height: '5\' 5"'$  (1.651 m)  Weight: 179 lb (81.194 kg)  SpO2: 95%        Assessment & Plan:   Lorelie Biermann is a 50 y.o. female Generalized anxiety disorder Situational anxiety  - Improved. Tolerating Prozac and episodic small dose of Klonopin. Would like to restart work. 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 Encouraged again to set up counseling either through her work [employer], or can check with her insurance carrier to see who also is in network. If other names needed, I can provide those. Continue Prozac same dose for now, Klonopin as needed, stress and stress management  techniques discussed, and recheck in 2 weeks.  Occipital headache  -Overall improved. Episodic symptoms, may be tension/stress headache versus occipital neuralgia. As infrequent and improved, no further workup at this time, but if persistent, would consider neurology evaluation. RTC precautions.  No orders of the defined types were placed in this encounter.   Patient Instructions       IF you received an x-ray today, you will receive an invoice from [facility]. Please contact [facility] at [phone] with questions or concerns regarding your invoice.   IF you received labwork today, you will receive an invoice from [employer]. Please contact [facility] at [phone] with questions or concerns regarding your invoice.  Our billing staff will not be able to assist you with questions regarding bills from these companies.  You will be contacted with the lab results as soon as they are available. The fastest way to get your results is to activate your My Chart account. Instructions are located on the last page of this paperwork. If you have not heard from [place] regarding the results in 2 weeks, please contact this office.     We recommend that you schedule a mammogram for breast cancer screening. Typically, you do not need a referral to do this. Please contact a local imaging center to schedule your mammogram.  [facility] - [phone]  *ask for the Radiology Department [facility]) - [phone] or [phone]  [facility] - [phone] [place] [phone] [facility] - [phone]  *ask for the [facility] - [phone]  *ask for the Radiology Department [facility] - [phone]  *ask for the [place] - [phone]  Continue range of motion and heat to the back of  your neck if needed for pain. You can also take Advil if you're having headaches. If these headaches persist, I can have a neurologist see as this may be from a pinched nerve in the neck.  Plan on return to work on Monday, see the letter that I printed for your employer. Follow-up with me in 2 weeks. However continue the Klonopin at bedtime as needed, continue the Prozac every day, and it is very important that you call and establish an appointment with a counselor. This can be through your [employer] at work, or call your insurance company to see who is in network. If any worsening of symptoms prior to our follow-up in 2 weeks, return sooner.  Make sure you read through the information on stress and stress management, and use the relaxation techniques we discussed when you do return to work.  Stress and Stress Management Stress is a normal reaction to life events. It is what you feel when life demands more than you are used to or more than you can handle. Some stress can be useful. For example, the stress reaction can help you catch the last bus of the day, study for a test, or meet a deadline at work. But stress that occurs too often or for too long can cause problems. It can affect your emotional health and interfere with relationships and normal daily activities. Too much stress can weaken your immune system and increase your risk for physical illness. If you already have a medical problem, stress can make it worse. CAUSES  All sorts of life events may cause stress. An event that causes stress for one person may not be stressful for another person. Major life events commonly cause stress. These may be positive or negative. Examples include losing your job, moving into a new home, getting married, having a baby, or losing a loved one. Less obvious life events may also cause stress, especially if they occur day after day or in combination. Examples include working long hours, driving in traffic, caring for  children, being in debt, or being in a difficult relationship. SIGNS AND SYMPTOMS Stress may cause emotional symptoms including, the following:  Anxiety. This is feeling worried, afraid, on edge, overwhelmed, or out of control.  Anger. This is feeling irritated or impatient.  Depression. This is feeling sad, down, helpless, or guilty.  Difficulty focusing, remembering, or making decisions. Stress may cause physical symptoms, including the following:   Aches and pains. These may affect your head, neck, back, stomach, or other areas of your body.  Tight muscles or clenched jaw.  Low energy or trouble sleeping. Stress may cause unhealthy behaviors, including the following:   Eating to feel better (overeating) or skipping meals.  Sleeping too little, too much, or both.  Working too much or putting off tasks (procrastination).  Smoking, drinking alcohol, or using drugs to feel better. DIAGNOSIS  Stress is diagnosed through an assessment by your health care provider. Your health care provider will ask questions about your symptoms and any stressful life events.Your health care provider will also ask about your medical history and may order blood tests or other tests. Certain medical conditions and medicine can cause physical symptoms similar to stress. Mental illness can cause emotional symptoms and unhealthy behaviors similar to stress. Your health care provider may refer you to a mental health professional for further evaluation.  TREATMENT  Stress management is the recommended treatment for stress.The goals of stress management are reducing stressful life events and coping with stress in healthy ways.  Techniques for reducing stressful life events include the following:  Stress identification. Self-monitor for stress and identify what causes stress for you. These skills may help you to avoid some stressful events.  Time management. Set your priorities, keep a calendar of events,  and learn to say "no." These tools can help you avoid making too many commitments. Techniques for coping with stress include the following:  Rethinking the problem. Try to think realistically about stressful events rather than ignoring them or overreacting. Try to find the positives in a stressful situation rather than focusing on the negatives.  Exercise. Physical exercise can release both physical and emotional tension. The key is to find a form of exercise you enjoy and do it regularly.  Relaxation techniques. These relax the body and mind. Examples include yoga, meditation, tai chi, biofeedback, deep breathing, progressive muscle relaxation, listening to music, being out in nature, journaling, and other hobbies. Again, the key is to find one or more that you enjoy and can do regularly.  Healthy lifestyle. Eat a balanced diet, get plenty of sleep, and do not smoke. Avoid using alcohol or drugs to relax.  Strong support network. Spend time with family, friends, or other people you enjoy being around.Express your feelings and talk things over with someone you trust. Counseling or talktherapy with a mental health professional may be helpful if you are having difficulty managing stress on your own. Medicine is typically not recommended for the treatment of stress.Talk to your health care provider if you think you need medicine for symptoms of stress. HOME CARE INSTRUCTIONS  Keep all follow-up visits as directed by your health care provider.  Take all medicines as directed by your health care provider. SEEK MEDICAL CARE IF:  Your symptoms get worse or you start having new symptoms.  You feel overwhelmed by your problems and can no longer manage them on your own. SEEK IMMEDIATE MEDICAL CARE IF:  You feel like hurting yourself or someone else.   This information is not intended to replace advice given to you by your health care provider. Make sure you discuss any questions you have with  your health care provider.   Document Released: 03/14/2001 Document Revised: 10/09/2014 Document Reviewed: 05/13/2013 Elsevier Interactive Patient Education 2016 Elsevier Inc. Generalized Anxiety Disorder Generalized anxiety disorder (GAD) is a  mental disorder. It interferes with life functions, including relationships, work, and school. GAD is different from normal anxiety, which everyone experiences at some point in their lives in response to specific life events and activities. Normal anxiety actually helps [place] prepare for and get through these life events and activities. Normal anxiety goes away after the event or activity is over.  GAD causes anxiety that is not necessarily related to specific events or activities. It also causes excess anxiety in proportion to specific events or activities. The anxiety associated with GAD is also difficult to control. GAD can vary from mild to severe. People with severe GAD can have intense waves of anxiety with physical symptoms (panic attacks).  SYMPTOMS The anxiety and worry associated with GAD are difficult to control. This anxiety and worry are related to many life events and activities and also occur more days than not for 6 months or longer. People with GAD also have three or more of the following symptoms (one or more in children):  Restlessness.   Fatigue.  Difficulty concentrating.   Irritability.  Muscle tension.  Difficulty sleeping or unsatisfying sleep. DIAGNOSIS GAD is diagnosed through an assessment by your health care provider. Your health care provider will ask you questions aboutyour mood,physical symptoms, and events in your life. Your health care provider may ask you about your medical history and use of alcohol or drugs, including prescription medicines. Your health care provider may also do a physical exam and blood tests. Certain medical conditions and the use of certain substances can cause symptoms similar to those  associated with GAD. Your health care provider may refer you to a mental health specialist for further evaluation. TREATMENT The following therapies are usually used to treat GAD:   Medication. Antidepressant medication usually is prescribed for long-term daily control. Antianxiety medicines may be added in severe cases, especially when panic attacks occur.   Talk therapy (psychotherapy). Certain types of talk therapy can be helpful in treating GAD by providing support, education, and guidance. A form of talk therapy called cognitive behavioral therapy can teach you healthy ways to think about and react to daily life events and activities.  Stress managementtechniques. These include yoga, meditation, and exercise and can be very helpful when they are practiced regularly. A mental health specialist can help determine which treatment is best for you. Some people see improvement with one therapy. However, other people require a combination of therapies.   This information is not intended to replace advice given to you by your health care provider. Make sure you discuss any questions you have with your health care provider.   Document Released: 01/13/2013 Document Revised: 10/09/2014 Document Reviewed: 01/13/2013 Elsevier Interactive Patient Education [employer].       I personally performed the services described in this documentation, which was scribed in my presence. The recorded information has been reviewed and considered, and addended by me as needed.   Signed,   Merri Ray, MD [facility].  03/21/2016 12:21 PM

## 2016-03-30 ENCOUNTER — Encounter: Payer: BLUE CROSS/BLUE SHIELD | Admitting: Family Medicine

## 2021-04-06 ENCOUNTER — Other Ambulatory Visit: Payer: Self-pay | Admitting: Internal Medicine

## 2021-04-06 DIAGNOSIS — Z1231 Encounter for screening mammogram for malignant neoplasm of breast: Secondary | ICD-10-CM

## 2022-08-24 ENCOUNTER — Encounter (HOSPITAL_COMMUNITY): Payer: Self-pay

## 2022-08-24 ENCOUNTER — Emergency Department (HOSPITAL_COMMUNITY)
Admission: EM | Admit: 2022-08-24 | Discharge: 2022-08-25 | Disposition: A | Discharge status: Home / Self Care | Payer: Self-pay | Attending: Emergency Medicine | Admitting: Emergency Medicine

## 2022-08-24 ENCOUNTER — Other Ambulatory Visit: Payer: Self-pay

## 2022-08-24 DIAGNOSIS — D72829 Elevated white blood cell count, unspecified: Secondary | ICD-10-CM | POA: Insufficient documentation

## 2022-08-24 DIAGNOSIS — Y900 Blood alcohol level of less than 20 mg/100 ml: Secondary | ICD-10-CM | POA: Insufficient documentation

## 2022-08-24 DIAGNOSIS — R4182 Altered mental status, unspecified: Secondary | ICD-10-CM | POA: Insufficient documentation

## 2022-08-24 DIAGNOSIS — F12929 Cannabis use, unspecified with intoxication, unspecified: Secondary | ICD-10-CM

## 2022-08-24 DIAGNOSIS — F12120 Cannabis abuse with intoxication, uncomplicated: Secondary | ICD-10-CM | POA: Insufficient documentation

## 2022-08-24 DIAGNOSIS — R0602 Shortness of breath: Secondary | ICD-10-CM | POA: Insufficient documentation

## 2022-08-24 LAB — I-STAT VENOUS BLOOD GAS, ED
Acid-Base Excess: 2 mmol/L (ref 0.0–2.0)
Bicarbonate: 26.8 mmol/L (ref 20.0–28.0)
Calcium, Ion: 1.18 mmol/L (ref 1.15–1.40)
HCT: 43 % (ref 36.0–46.0)
Hemoglobin: 14.6 g/dL (ref 12.0–15.0)
O2 Saturation: 81 %
Potassium: 3.5 mmol/L (ref 3.5–5.1)
Sodium: 139 mmol/L (ref 135–145)
TCO2: 28 mmol/L (ref 22–32)
pCO2, Ven: 42.1 mmHg — ABNORMAL LOW (ref 44–60)
pH, Ven: 7.411 (ref 7.25–7.43)
pO2, Ven: 45 mmHg (ref 32–45)

## 2022-08-24 LAB — CBG MONITORING, ED: Glucose-Capillary: 127 mg/dL — ABNORMAL HIGH (ref 70–99)

## 2022-08-24 MED ORDER — SODIUM CHLORIDE 0.9 % IV BOLUS
1000.0000 mL | Freq: Once | INTRAVENOUS | Status: AC
  Administered 2022-08-24: 1000 mL via INTRAVENOUS

## 2022-08-24 NOTE — ED Provider Notes (Signed)
Courtney Ramsey [facility] EMERGENCY DEPARTMENT Provider Note   CSN: [id number] Arrival date & time:        History  Chief Complaint  Patient presents with   Altered Mental Status    LEVEL 5 CAVEAT 2/2 ALTERED MENTAL STATUS  Courtney Ramsey is a 56 y.o. female.  56 y/o female with no significant PMH presents to the ED for altered mental status. Patient took a shot of liquor and had a mixed drink with a CBD gummie around 2000 tonight; she was with family at Thanksgiving dinner. Went home and son received a call from his father that the patient was becoming altered and c/o SOB. Patient shakes head "no" when asked whether she has pain. Also denies nausea. She has not had any vomiting. No other known co-ingestion, per son. Son denies PMH or FHx of ACS. No recent surgeries or hospitalizations. No use of chronic O2 at baseline.  The history is provided by the patient and a relative. No language interpreter was used.  Altered Mental Status      Home Medications Prior to Admission medications   Medication Sig Start Date End Date Taking? Authorizing Provider  clonazePAM (KLONOPIN) 0.5 MG tablet Take 0.5-1 tablets (0.25-0.5 mg total) by mouth 3 (three) times daily as needed for anxiety. 03/04/16   Shade Flood, MD  FLUoxetine (PROZAC) 20 MG tablet Take 1 tablet (20 mg total) by mouth daily. 03/04/16   Shade Flood, MD  S-Adenosylmethionine (MOOD PLUS SAM-E) 200 MG TBEC Take by mouth.    [provider]      Allergies    Patient has no known allergies.    Review of Systems   Review of Systems  Unable to perform ROS: Mental status change    Physical Exam Updated Vital Signs BP 102/83   Pulse 62   Temp 97.7 F (36.5 C) (Oral)   Resp 18   SpO2 93%   Physical Exam Vitals and nursing note reviewed.  Constitutional:      Appearance: She is well-developed. She is not diaphoretic.     Comments: Alert, but altered. Moaning and shaking head in bed from side to  side. In no acute distress.  HENT:     Head: Normocephalic and atraumatic.  Eyes:     General: No scleral icterus.    Conjunctiva/sclera: Conjunctivae normal.  Cardiovascular:     Rate and Rhythm: Normal rate and regular rhythm.     Pulses: Normal pulses.  Pulmonary:     Effort: Pulmonary effort is normal. No respiratory distress.     Breath sounds: No stridor. No wheezing.     Comments: Respirations even and unlabored Abdominal:     Palpations: Abdomen is soft.     Comments: Soft, obese abdomen.  Musculoskeletal:        General: Normal range of motion.     Cervical back: Normal range of motion.  Skin:    General: Skin is warm and dry.     Coloration: Skin is not pale.     Findings: No erythema or rash.  Neurological:     Mental Status: She is oriented to person, place, and time.     GCS: GCS eye subscore is 4. GCS verbal subscore is 2. GCS motor subscore is 5.     Comments: Moving all extremities spontaneously.  Psychiatric:        Behavior: Behavior normal.     ED Results / Procedures / Treatments   Labs (all labs  ordered are listed, but only abnormal results are displayed) Labs Reviewed  CBC WITH DIFFERENTIAL/PLATELET - Abnormal; Notable for the following components:      Result Value   WBC 12.6 (*)    Monocytes Absolute 1.9 (*)    All other components within normal limits  COMPREHENSIVE METABOLIC PANEL - Abnormal; Notable for the following components:   Glucose, Bld 113 (*)    AST 14 (*)    All other components within normal limits  RAPID URINE DRUG SCREEN, HOSP PERFORMED - Abnormal; Notable for the following components:   Tetrahydrocannabinol POSITIVE (*)    All other components within normal limits  ACETAMINOPHEN LEVEL - Abnormal; Notable for the following components:   Acetaminophen (Tylenol), Serum <10 (*)    All other components within normal limits  SALICYLATE LEVEL - Abnormal; Notable for the following components:   Salicylate Lvl <7.0 (*)    All other  components within normal limits  CBG MONITORING, ED - Abnormal; Notable for the following components:   Glucose-Capillary 127 (*)    All other components within normal limits  I-STAT VENOUS BLOOD GAS, ED - Abnormal; Notable for the following components:   pCO2, Ven 42.1 (*)    All other components within normal limits  ETHANOL  TROPONIN I (HIGH SENSITIVITY)  TROPONIN I (HIGH SENSITIVITY)    EKG EKG Interpretation  Date/Time:  Thursday August 24 2022 23:36:14 EST Ventricular Rate:  90 PR Interval:  151 QRS Duration: 91 QT Interval:  361 QTC Calculation: 442 R Axis:   81 Text Interpretation: Sinus rhythm Low voltage, precordial leads Otherwise within normal limits No old tracing to compare Confirmed by Dione Booze ([id number]) on 08/24/2022 11:42:19 PM  Radiology DG Chest 2 View  Result Date: 08/25/2022 CLINICAL DATA:  Shortness of breath EXAM: CHEST - 2 VIEW COMPARISON:  07/01/2013 FINDINGS: Heart is normal size. Bibasilar opacities, favor atelectasis. No effusions. No acute bony abnormality. IMPRESSION: Bibasilar atelectasis. Electronically Signed   By: Charlett Nose M.D.   On: 08/25/2022 01:55    Procedures Procedures    Medications Ordered in ED Medications  sodium chloride 0.9 % bolus 1,000 mL (0 mLs Intravenous Stopped 08/25/22 0106)  sodium chloride 0.9 % bolus 1,000 mL (1,000 mLs Intravenous New Bag/Given 08/25/22 0444)    ED Course/ Medical Decision Making/ A&P Clinical Course as of 08/25/22 0612  Thu Aug 24, 2022  2354 Patient visibly altered. GCS 11. She has a BP of 90 systolic at beside. No tachycardia. Will give IVF. Broad work up initiated, though currently substance use is favored to be the cause of mental status change. CXR and EKG ordered given c/o SOB prior to arrival. Patient with sats of 88% on RA. She was placed on 2L O2 via [place] with sat improvement to 96%. [KH]  Fri Aug 25, 2022  0226 Patient sleeping comfortably in NAD. Discussed lab results with son who  verbalizes understanding. Her work up, other than showing THC on the UDS, has been rather unremarkable. CXR with bibasilar atelectasis; no PTX, PNA, effusion. Acute intoxication suspected. [KH]  0331 Repeat troponin flat; negative. [KH]  0430 Patient reassessed. BP has been low, but patient is sleeping. She wakes easily to physical touch or to voice. She has no acute complaints. Denies feeling SOB at present. [KH]  [phone] Patient able to ambulate with assistance. BP improved once awake and mobile. Sats 94% on room air. [KH]  0550 Will give PO for fluid challenge. Son currently planning to return at 0700  to pick up the patient if continued improvement in her clinical state. [KH]    Clinical Course User Index [KH] Antony Madura, PA-C                           Medical Decision Making Amount and/or Complexity of Data Reviewed Labs: ordered. Radiology: ordered.   This patient presents to the ED for concern of altered mental status, this involves an extensive number of treatment options, and is a complaint that carries with it a high risk of complications and morbidity.  The differential diagnosis includes intoxication vs ICH vs encephalopathy vs hypoxia vs hypoglycemia vs arrhythmia    Co morbidities that complicate the patient evaluation  Obesity    Additional history obtained:  Additional history obtained from son, at bedside. External records from outside source obtained and reviewed including PCP visit from 2017 which references hx of GAD   Lab Tests:  I Ordered, and personally interpreted labs.  The pertinent results include:  leukocytosis of 12.6 (unclear), normal CMP, VBG, APAP level, ASA level, ethanol. UDS positive for marijuana.   Imaging Studies ordered:  I ordered imaging studies including CXR  I independently visualized and interpreted imaging which showed bibasilar atelectasis I agree with the radiologist interpretation   Cardiac Monitoring:  The patient was  maintained on a cardiac monitor.  I personally viewed and interpreted the cardiac monitored which showed an underlying rhythm of: NSR   Medicines ordered and prescription drug management:  I ordered medication including IVF for supportive care  Reevaluation of the patient after these medicines showed that the patient improved I have reviewed the patients home medicines and have made adjustments as needed   Test Considered:  Ammonia level   Problem List / ED Course:  As above   Reevaluation:  After the interventions noted above, I reevaluated the patient and found that they have :improved   Dispostion:  After consideration of the diagnostic results and the patients response to treatment, I feel that the patent would benefit from cessation of marijuana/CBD use as well as PCP follow up for recheck.   Pending final reassessment to ensure stability for discharge; care signed out to [place], [place] at shift change.         Final Clinical Impression(s) / ED Diagnoses Final diagnoses:  Altered mental status, unspecified altered mental status type  Cannabis intoxication with complication [facility])    Rx / DC Orders ED Discharge Orders     None         Antony Madura, PA-C 08/25/22 0612    Dione Booze, MD 08/25/22 [phone]

## 2022-08-24 NOTE — ED Triage Notes (Signed)
Pt BIB [facility] from home w c/o of AMS. Son stated pt took a THC gummy, a mixed drink, and a shot all together and 30 mins was acting different.   EMS VS BP 140/82 P 84 O2 98% RA CBG 144

## 2022-08-25 ENCOUNTER — Emergency Department (HOSPITAL_COMMUNITY): Payer: Self-pay

## 2022-08-25 LAB — ACETAMINOPHEN LEVEL: Acetaminophen (Tylenol), Serum: <10 ug/mL — ABNORMAL LOW (ref 10–30)

## 2022-08-25 LAB — COMPREHENSIVE METABOLIC PANEL
ALT: 11 U/L (ref 0–44)
AST: 14 U/L — ABNORMAL LOW (ref 15–41)
Albumin: 3.5 g/dL (ref 3.5–5.0)
Alkaline Phosphatase: 62 U/L (ref 38–126)
Anion gap: 8 (ref 5–15)
BUN: 15 mg/dL (ref 6–20)
CO2: 24 mmol/L (ref 22–32)
Calcium: 9.3 mg/dL (ref 8.9–10.3)
Chloride: 106 mmol/L (ref 98–111)
Creatinine, Ser: 0.86 mg/dL (ref 0.44–1.00)
GFR, Estimated: >60 mL/min (ref >60)
Glucose, Bld: 113 mg/dL — ABNORMAL HIGH (ref 70–99)
Potassium: 3.6 mmol/L (ref 3.5–5.1)
Sodium: 138 mmol/L (ref 135–145)
Total Bilirubin: 0.5 mg/dL (ref 0.3–1.2)
Total Protein: 6.7 g/dL (ref 6.5–8.1)

## 2022-08-25 LAB — CBC WITH DIFFERENTIAL/PLATELET
Abs Immature Granulocytes: 0 10*3/uL (ref 0.00–0.07)
Basophils Absolute: 0 10*3/uL (ref 0.0–0.1)
Basophils Relative: 0 %
Eosinophils Absolute: 0 10*3/uL (ref 0.0–0.5)
Eosinophils Relative: 0 %
HCT: 43.1 % (ref 36.0–46.0)
Hemoglobin: 14.8 g/dL (ref 12.0–15.0)
Lymphocytes Relative: 26 %
Lymphs Abs: 3.3 10*3/uL (ref 0.7–4.0)
MCH: 31.4 pg (ref 26.0–34.0)
MCHC: 34.3 g/dL (ref 30.0–36.0)
MCV: 91.5 fL (ref 80.0–100.0)
Monocytes Absolute: 1.9 10*3/uL — ABNORMAL HIGH (ref 0.1–1.0)
Monocytes Relative: 15 %
Neutro Abs: 7.4 10*3/uL (ref 1.7–7.7)
Neutrophils Relative %: 59 %
Platelets: 267 10*3/uL (ref 150–400)
RBC: 4.71 MIL/uL (ref 3.87–5.11)
RDW: 13.2 % (ref 11.5–15.5)
WBC Morphology: >10
WBC: 12.6 10*3/uL — ABNORMAL HIGH (ref 4.0–10.5)
nRBC: 0 % (ref 0.0–0.2)

## 2022-08-25 LAB — SALICYLATE LEVEL: Salicylate Lvl: <7 mg/dL — ABNORMAL LOW (ref 7.0–30.0)

## 2022-08-25 LAB — RAPID URINE DRUG SCREEN, HOSP PERFORMED
Amphetamines: NOT DETECTED
Barbiturates: NOT DETECTED
Benzodiazepines: NOT DETECTED
Cocaine: NOT DETECTED
Opiates: NOT DETECTED
Tetrahydrocannabinol: POSITIVE — AB

## 2022-08-25 LAB — TROPONIN I (HIGH SENSITIVITY)
Troponin I (High Sensitivity): 7 ng/L (ref <18)
Troponin I (High Sensitivity): 9 ng/L (ref <18)

## 2022-08-25 LAB — ETHANOL: Alcohol, Ethyl (B): <10 mg/dL (ref <10)

## 2022-08-25 MED ORDER — SODIUM CHLORIDE 0.9 % IV BOLUS
1000.0000 mL | Freq: Once | INTRAVENOUS | Status: AC
  Administered 2022-08-25: 1000 mL via INTRAVENOUS

## 2022-08-25 NOTE — ED Notes (Signed)
Pt ambulatory to RR without difficulty and without incident. Gait slow and slightly unsteady but pt is able to ambulate without assistance.

## 2022-08-25 NOTE — ED Notes (Signed)
[place], [place] made aware of pts BP.

## 2022-08-25 NOTE — ED Notes (Signed)
Pt verbalizes understanding of discharge instructions. Opportunity for questions and answers were provided. Pt discharged from the ED.   ?

## 2022-11-02 NOTE — Progress Notes (Signed)
This encounter was created in error - please disregard.

## 2024-03-25 ENCOUNTER — Other Ambulatory Visit: Payer: Self-pay | Admitting: Family Medicine

## 2024-03-25 DIAGNOSIS — M5431 Sciatica, right side: Secondary | ICD-10-CM

## 2024-03-28 ENCOUNTER — Encounter: Payer: Self-pay | Admitting: Family Medicine

## 2024-03-31 ENCOUNTER — Ambulatory Visit
Admission: RE | Admit: 2024-03-31 | Discharge: 2024-03-31 | Disposition: A | Discharge status: Home / Self Care | Payer: Self-pay | Source: Ambulatory Visit | Attending: Family Medicine | Admitting: Family Medicine

## 2024-03-31 DIAGNOSIS — M5431 Sciatica, right side: Secondary | ICD-10-CM
